# Patient Record
Sex: Female | Born: 1965 | Race: White | Hispanic: No | Marital: Married | Smoking: Never smoker
Health system: Southern US, Community
[De-identification: ages and names within clinical notes are randomized; demographics above are authoritative.]

## PROBLEM LIST (undated history)

## (undated) DIAGNOSIS — M5136 Other intervertebral disc degeneration, lumbar region: Secondary | ICD-10-CM

## (undated) DIAGNOSIS — M51369 Other intervertebral disc degeneration, lumbar region without mention of lumbar back pain or lower extremity pain: Secondary | ICD-10-CM

## (undated) DIAGNOSIS — J302 Other seasonal allergic rhinitis: Secondary | ICD-10-CM

## (undated) DIAGNOSIS — R51 Headache: Secondary | ICD-10-CM

## (undated) DIAGNOSIS — Z9889 Other specified postprocedural states: Secondary | ICD-10-CM

## (undated) DIAGNOSIS — K8020 Calculus of gallbladder without cholecystitis without obstruction: Secondary | ICD-10-CM

## (undated) DIAGNOSIS — R112 Nausea with vomiting, unspecified: Secondary | ICD-10-CM

## (undated) HISTORY — DX: Calculus of gallbladder without cholecystitis without obstruction: K80.20

## (undated) HISTORY — PX: OTHER SURGICAL HISTORY: SHX169

---

## 2005-07-07 ENCOUNTER — Other Ambulatory Visit: Admission: RE | Admit: 2005-07-07 | Discharge: 2005-07-07 | Payer: Self-pay | Admitting: Family Medicine

## 2010-10-13 ENCOUNTER — Other Ambulatory Visit (HOSPITAL_COMMUNITY)
Admission: RE | Admit: 2010-10-13 | Discharge: 2010-10-13 | Disposition: A | Discharge status: Home / Self Care | Payer: 59 | Source: Ambulatory Visit | Attending: Family Medicine | Admitting: Family Medicine

## 2010-10-13 DIAGNOSIS — Z124 Encounter for screening for malignant neoplasm of cervix: Secondary | ICD-10-CM | POA: Insufficient documentation

## 2010-11-05 ENCOUNTER — Other Ambulatory Visit: Payer: Self-pay | Admitting: Family Medicine

## 2010-11-05 DIAGNOSIS — Z1231 Encounter for screening mammogram for malignant neoplasm of breast: Secondary | ICD-10-CM

## 2010-11-13 ENCOUNTER — Ambulatory Visit
Admission: RE | Admit: 2010-11-13 | Discharge: 2010-11-13 | Disposition: A | Discharge status: Home / Self Care | Payer: 59 | Source: Ambulatory Visit | Attending: Family Medicine | Admitting: Family Medicine

## 2010-11-13 DIAGNOSIS — Z1231 Encounter for screening mammogram for malignant neoplasm of breast: Secondary | ICD-10-CM

## 2011-06-05 ENCOUNTER — Ambulatory Visit
Admission: RE | Admit: 2011-06-05 | Discharge: 2011-06-05 | Disposition: A | Discharge status: Home / Self Care | Payer: 59 | Source: Ambulatory Visit | Attending: Family Medicine | Admitting: Family Medicine

## 2011-06-05 ENCOUNTER — Other Ambulatory Visit: Payer: Self-pay | Admitting: Family Medicine

## 2011-06-05 DIAGNOSIS — M545 Low back pain: Secondary | ICD-10-CM

## 2011-06-05 DIAGNOSIS — G8929 Other chronic pain: Secondary | ICD-10-CM

## 2011-11-24 ENCOUNTER — Emergency Department (HOSPITAL_COMMUNITY): Payer: BC Managed Care – PPO

## 2011-11-24 ENCOUNTER — Emergency Department (HOSPITAL_COMMUNITY)
Admission: EM | Admit: 2011-11-24 | Discharge: 2011-11-24 | Disposition: A | Discharge status: Home / Self Care | Payer: BC Managed Care – PPO | Attending: Emergency Medicine | Admitting: Emergency Medicine

## 2011-11-24 ENCOUNTER — Encounter (HOSPITAL_COMMUNITY): Payer: Self-pay | Admitting: *Deleted

## 2011-11-24 DIAGNOSIS — M545 Low back pain, unspecified: Secondary | ICD-10-CM | POA: Insufficient documentation

## 2011-11-24 DIAGNOSIS — R109 Unspecified abdominal pain: Secondary | ICD-10-CM | POA: Insufficient documentation

## 2011-11-24 DIAGNOSIS — R112 Nausea with vomiting, unspecified: Secondary | ICD-10-CM | POA: Insufficient documentation

## 2011-11-24 LAB — URINALYSIS, ROUTINE W REFLEX MICROSCOPIC
Glucose, UA: NEGATIVE mg/dL
Hgb urine dipstick: NEGATIVE
Leukocytes, UA: NEGATIVE
Specific Gravity, Urine: 1.024 (ref 1.005–1.030)
Urobilinogen, UA: 1 mg/dL (ref 0.0–1.0)

## 2011-11-24 LAB — URINE MICROSCOPIC-ADD ON

## 2011-11-24 LAB — DIFFERENTIAL
Eosinophils Absolute: 0 10*3/uL (ref 0.0–0.7)
Eosinophils Relative: 0 % (ref 0–5)
Lymphs Abs: 0.8 10*3/uL (ref 0.7–4.0)
Monocytes Absolute: 0.3 10*3/uL (ref 0.1–1.0)
Monocytes Relative: 3 % (ref 3–12)

## 2011-11-24 LAB — COMPREHENSIVE METABOLIC PANEL
Alkaline Phosphatase: 69 U/L (ref 39–117)
BUN: 12 mg/dL (ref 6–23)
Calcium: 9.4 mg/dL (ref 8.4–10.5)
Creatinine, Ser: 0.68 mg/dL (ref 0.50–1.10)
GFR calc Af Amer: >90 mL/min (ref >90)
Glucose, Bld: 117 mg/dL — ABNORMAL HIGH (ref 70–99)
Potassium: 3.2 mEq/L — ABNORMAL LOW (ref 3.5–5.1)
Total Protein: 7.5 g/dL (ref 6.0–8.3)

## 2011-11-24 LAB — CBC
Hemoglobin: 12.1 g/dL (ref 12.0–15.0)
MCH: 27.7 pg (ref 26.0–34.0)
MCV: 79.4 fL (ref 78.0–100.0)
RBC: 4.37 MIL/uL (ref 3.87–5.11)

## 2011-11-24 MED ORDER — MORPHINE SULFATE 4 MG/ML IJ SOLN
4.0000 mg | Freq: Once | INTRAMUSCULAR | Status: AC
  Administered 2011-11-24: 4 mg via INTRAVENOUS
  Filled 2011-11-24: qty 1

## 2011-11-24 MED ORDER — ONDANSETRON 4 MG PO TBDP: 4.0000 mg | ORAL_TABLET | Freq: Three times a day (TID) | ORAL | Status: AC | PRN

## 2011-11-24 MED ORDER — IOHEXOL 300 MG/ML  SOLN
20.0000 mL | INTRAMUSCULAR | Status: AC
  Administered 2011-11-24: 20 mL via ORAL

## 2011-11-24 MED ORDER — IOHEXOL 300 MG/ML  SOLN
100.0000 mL | Freq: Once | INTRAMUSCULAR | Status: AC | PRN
  Administered 2011-11-24: 100 mL via INTRAVENOUS

## 2011-11-24 MED ORDER — OXYCODONE-ACETAMINOPHEN 5-325 MG PO TABS: 1.0000 | ORAL_TABLET | Freq: Four times a day (QID) | ORAL | Status: AC | PRN

## 2011-11-24 MED ORDER — SODIUM CHLORIDE 0.9 % IV SOLN
INTRAVENOUS | Status: DC
  Administered 2011-11-24 (×2): via INTRAVENOUS

## 2011-11-24 MED ORDER — ONDANSETRON HCL 4 MG/2ML IJ SOLN
4.0000 mg | Freq: Once | INTRAMUSCULAR | Status: AC
  Administered 2011-11-24: 4 mg via INTRAVENOUS
  Filled 2011-11-24: qty 2

## 2011-11-24 NOTE — ED Notes (Signed)
Pt states she woke up with lower back pain that has now radiated to right lower quadrant associated with nausea.

## 2011-11-24 NOTE — ED Provider Notes (Signed)
Medical screening examination/treatment/procedure(s) were conducted as a shared visit with non-physician practitioner(s) and myself.  I personally evaluated the patient during the encounter  Doug Sou, MD 11/24/11 1615

## 2011-11-24 NOTE — ED Provider Notes (Signed)
Complains of abdominal pain for several weeks. Pain became worse last night accompanied by nausea and diarrhea after eating barbecue. On exam appears mildly uncomfortable abdomen nondistended normal active bowel sounds mild tenderness at epigastrium no guarding rigidity or rebound. Pain is felt to be nonspecific  Doug Sou, MD 11/24/11 1305

## 2011-11-24 NOTE — ED Provider Notes (Signed)
Radiology and lab results reviewed, discussed with patient and with Dr. Radford Pax.  Gallstones with possible mild wall thickening, no ductal dilatation.  No elevation in liver enzymes or white count.  Pain controlled with MS and nausea controlled with zofran.  Patient tolerating po's.  Patient resting comfortably at present with family at bedside.  Spoke with on-call central  surgery--will follow-up in office--have patient call to schedule appointment.  Jimmye Norman, NP 11/24/11 2032

## 2011-11-24 NOTE — ED Notes (Signed)
Pt to CT via stretcher

## 2011-11-24 NOTE — ED Notes (Signed)
Pt ambulatory to bathroom without any problems 

## 2011-11-24 NOTE — ED Notes (Signed)
PAITENT IS SIPPING HER CT PREP AT THIS TIME. PT AWARE SHE CAN GO TO THE RESTROOM AD LIB. STATES HER PAIN IS MUCH BETTER AND SHE IS ABLE TO REST NOW.

## 2011-11-24 NOTE — Discharge Instructions (Signed)
Cholelithiasis Cholelithiasis (also called gallstones) is a form of gallbladder disease where gallstones form in your gallbladder. The gallbladder is a non-essential organ that stores bile made in the liver, which helps digest fats. Gallstones begin as small crystals and slowly grow into stones. Gallstone pain occurs when the gallbladder spasms, and a gallstone is blocking the duct. Pain can also occur when a stone passes out of the duct.  Women are more likely to develop gallstones than men. Other factors that increase the risk of gallbladder disease are:  Having multiple pregnancies. Physicians sometimes advise removing diseased gallbladders before future pregnancies.   Obesity.   Diets heavy in fried foods and fat.   Increasing age (older than 60).   Prolonged use of medications containing female hormones.   Diabetes mellitus.   Rapid weight loss.   Family history of gallstones (heredity).  SYMPTOMS  Feeling sick to your stomach (nauseous).   Abdominal pain.   Yellowing of the skin (jaundice).   Sudden pain. It may persist from several minutes to several hours.   Worsening pain with deep breathing or when jarred.   Fever.   Tenderness to the touch.  In some cases, when gallstones do not move into the bile duct, people have no pain or symptoms. These are called "silent" gallstones. TREATMENT In severe cases, emergency surgery may be required. HOME CARE INSTRUCTIONS   Only take over-the-counter or prescription medicines for pain, discomfort, or fever as directed by your caregiver.   Follow a low-fat diet until seen again. Fat causes the gallbladder to contract, which can result in pain.   Follow up as instructed. Attacks are almost always recurrent and surgery is usually required for permanent treatment.  SEEK IMMEDIATE MEDICAL CARE IF:   Your pain increases and is not controlled by medications.   You have an oral temperature above 102 F (38.9 C), not controlled by  medication.   You develop nausea and vomiting.  MAKE SURE YOU:   Understand these instructions.   Will watch your condition.   Will get help right away if you are not doing well or get worse.  Document Released: 08/06/2005 Document Revised: 07/30/2011 Document Reviewed: 10/09/2010 [employer] Patient Information 2012 [employer]. 

## 2011-11-24 NOTE — ED Provider Notes (Signed)
History     CSN: [id number]  Arrival date & time 11/24/11  1129   First MD Initiated Contact with Patient 11/24/11 1145      Chief Complaint  Patient presents with  . Back Pain  . Abdominal Pain  . Nausea    (Consider location/radiation/quality/duration/timing/severity/associated sxs/prior treatment) HPI Comments: Patient with no significant past medical history awoke this morning with right lower back pain that radiated around to her right flank and right lower abdomen. She has had multiple episodes of nausea and vomiting with this. Patient saw her primary care doctor this morning and was sent to the emergency department to rule out appendicitis or kidney stone. Patient denies fever or respiratory tract infection symptoms. She denies chest pain or shortness of breath. She denies dysuria, hematuria, vaginal discharge or bleeding. She has not had any diarrhea. The patient does not have any history of abdominal surgeries.  Patient is a 46 y.o. female presenting with abdominal pain. The history is provided by the patient.  Abdominal Pain The primary symptoms of the illness include abdominal pain, nausea and vomiting. The primary symptoms of the illness do not include fever, shortness of breath, diarrhea, dysuria, vaginal discharge or vaginal bleeding. The current episode started 3 to 5 hours ago.  Additional symptoms associated with the illness include back pain. Symptoms associated with the illness do not include constipation or hematuria.    History reviewed. No pertinent past medical history.  History reviewed. No pertinent past surgical history.  History reviewed. No pertinent family history.  History  Substance Use Topics  . Smoking status: Never Smoker   . Smokeless tobacco: Not on file  . Alcohol Use: No    OB History    Grav Para Term Preterm Abortions TAB SAB Ect Mult Living                  Review of Systems  Constitutional: Negative for fever.  HENT: Negative for  sore throat and rhinorrhea.   Eyes: Negative for redness.  Respiratory: Negative for cough and shortness of breath.   Cardiovascular: Negative for chest pain.  Gastrointestinal: Positive for nausea, vomiting and abdominal pain. Negative for diarrhea and constipation.  Genitourinary: Negative for dysuria, hematuria, vaginal bleeding and vaginal discharge.  Musculoskeletal: Positive for back pain. Negative for myalgias.  Skin: Negative for rash.  Neurological: Negative for headaches.    Allergies  Review of patient's allergies indicates not on file.  Home Medications  No current outpatient prescriptions on file.  BP 123/87  Pulse 79  Temp(Src) 97.7 F (36.5 C) (Oral)  Resp 18  SpO2 100%  Physical Exam  Nursing note and vitals reviewed. Constitutional: She is oriented to person, place, and time. She appears well-developed and well-nourished.  HENT:  Head: Normocephalic and atraumatic.  Eyes: Conjunctivae are normal. Right eye exhibits no discharge. Left eye exhibits no discharge.  Neck: Normal range of motion. Neck supple.  Cardiovascular: Normal rate, regular rhythm and normal heart sounds.   Pulmonary/Chest: Effort normal and breath sounds normal.  Abdominal: Soft. She exhibits no distension. There is tenderness. There is no rigidity, no rebound, no guarding, no tenderness at McBurney's point and negative Murphy's sign.         Patient states that she has tenderness from right middle abdomen to umbilicus. She does not wince on exam. She has to search for the pain on herself by pushing on her abdomen multiple times.  Neurological: She is alert and oriented to person, place, and  time.  Skin: Skin is warm and dry.  Psychiatric: She has a normal mood and affect.    ED Course  Procedures (including critical care time)  Labs Reviewed  URINALYSIS, ROUTINE W REFLEX MICROSCOPIC - Abnormal; Notable for the following:    pH 8.5 (*)    Ketones, ur 15 (*)    Protein, ur 30 (*)     All other components within normal limits  CBC - Abnormal; Notable for the following:    HCT 34.7 (*)    All other components within normal limits  DIFFERENTIAL - Abnormal; Notable for the following:    Neutrophils Relative 88 (*)    Neutro Abs 8.9 (*)    Lymphocytes Relative 8 (*)    All other components within normal limits  COMPREHENSIVE METABOLIC PANEL - Abnormal; Notable for the following:    Potassium 3.2 (*)    Glucose, Bld 117 (*)    All other components within normal limits  URINE MICROSCOPIC-ADD ON  PREGNANCY, URINE   No results found.   1. Abdominal pain     12:05 PM Patient seen and examined. Work-up initiated. Medications ordered.   Vital signs reviewed and are as follows: Filed Vitals:   11/24/11 1135  BP: 123/87  Pulse: 79  Temp: 97.7 F (36.5 C)  Resp: 18   Patient was discussed with Dr. Ethelda Chick.  2:25 PM Patient moved to CDU pending CT abd/pelvis. Handoff to [employer].  Plan: control pain, dispo per CT results. If neg, symptomatic treatment and have patient follow-up with PCP.   MDM  Abd pain, r/o appendicitis. CT pending.         Renne Crigler, [place] 11/24/11 1427

## 2011-11-24 NOTE — ED Notes (Signed)
Pt resting quietly at this time.  Husband at bedside. 

## 2011-11-24 NOTE — ED Notes (Signed)
Pt finished drinking contrast. CT made aware.

## 2011-11-26 NOTE — ED Provider Notes (Signed)
Medical screening examination/treatment/procedure(s) were performed by non-physician practitioner and as supervising physician I was immediately available for consultation/collaboration.    Nelia Shi, MD 11/26/11 [phone]

## 2011-11-28 IMAGING — CR DG SACRUM/COCCYX 2+V
4 series · 4 of 4 positions shown · non-contrast
Comparison: None

CLINICAL DATA: Chronic low back pain

SACRUM AND COCCYX - 2+ VIEW

[view not recorded (1 of 4)]
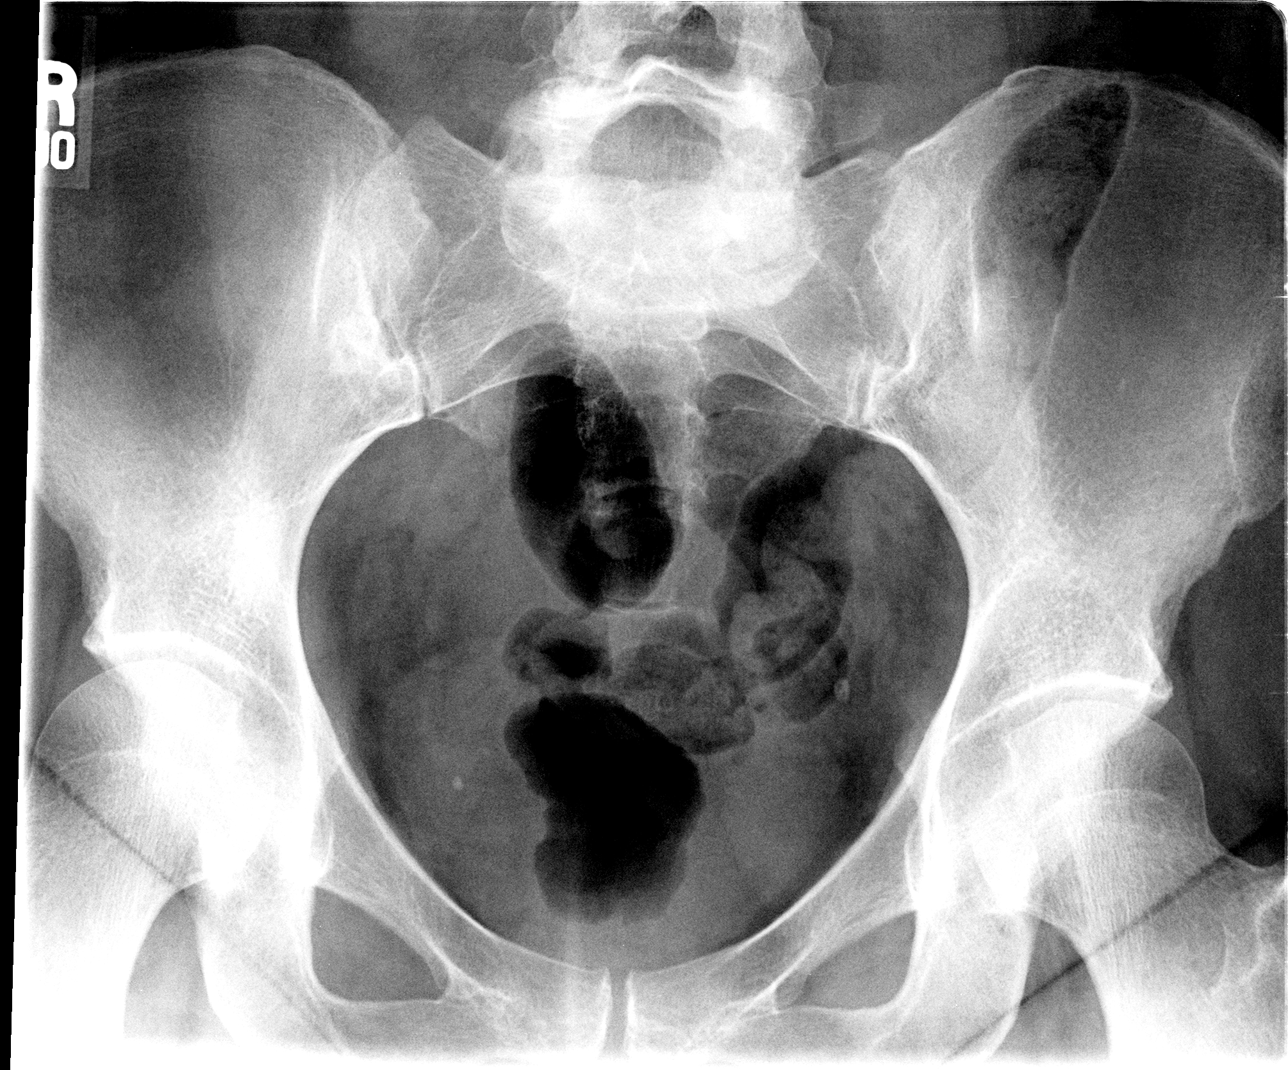

[view not recorded (2 of 4)]
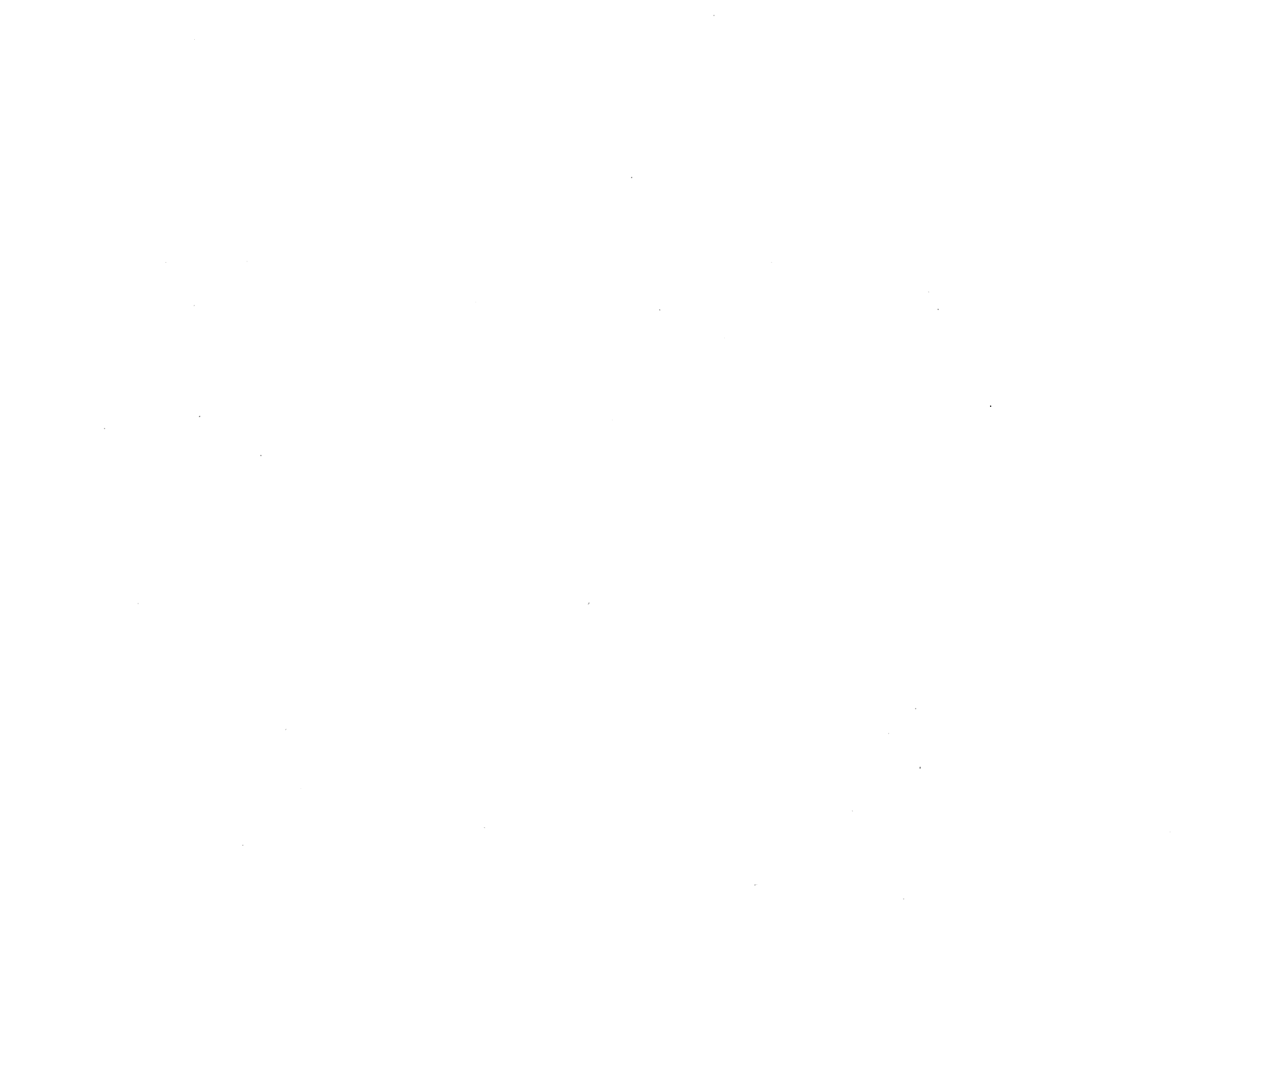

[view not recorded (3 of 4)]
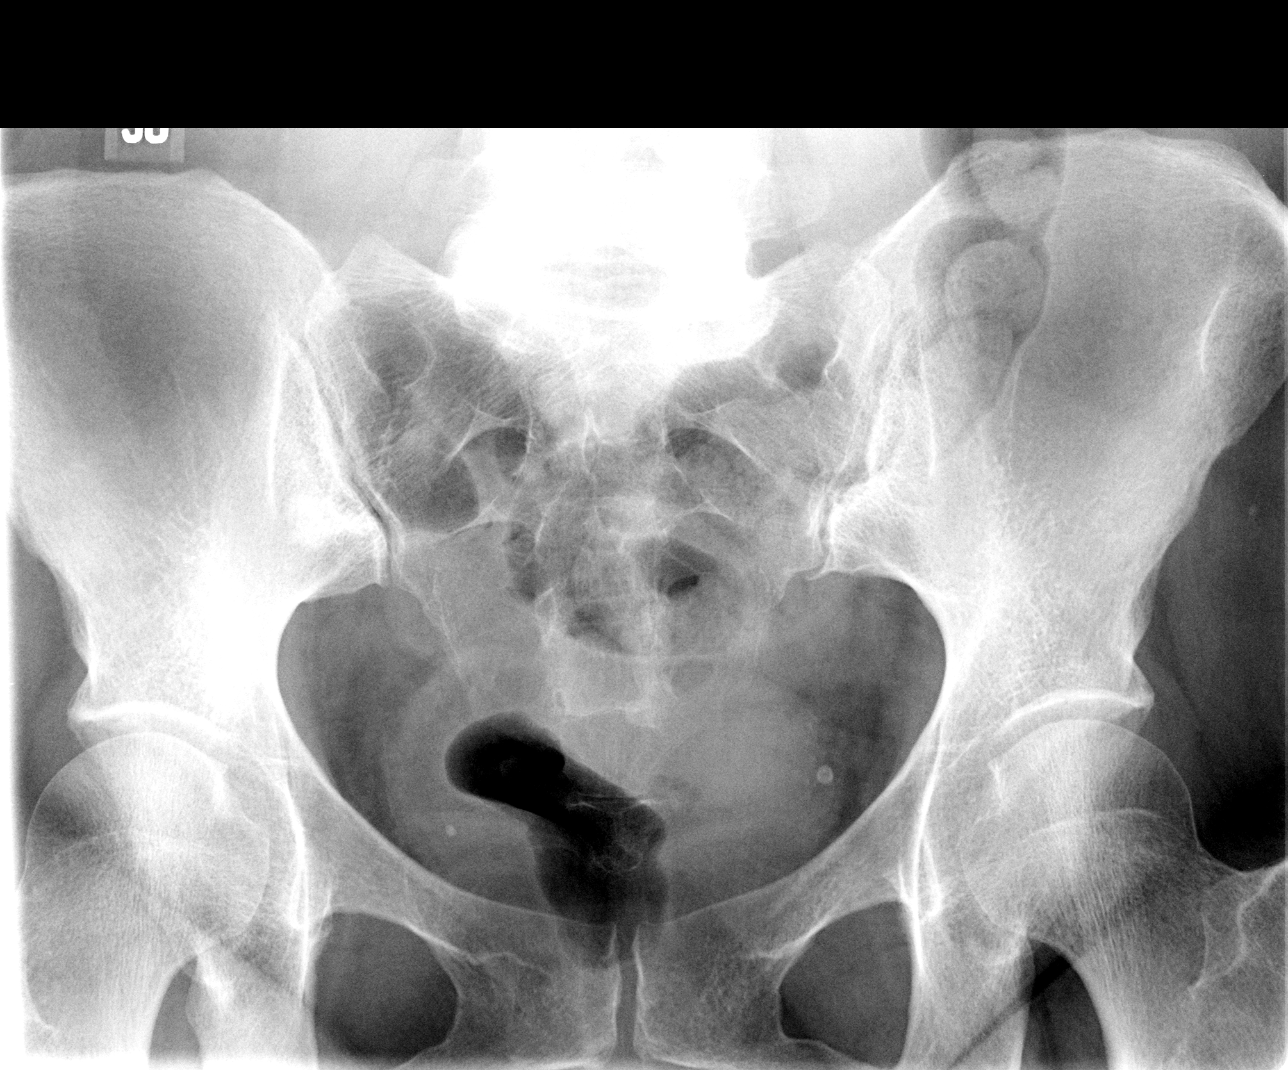

[view not recorded (4 of 4)]
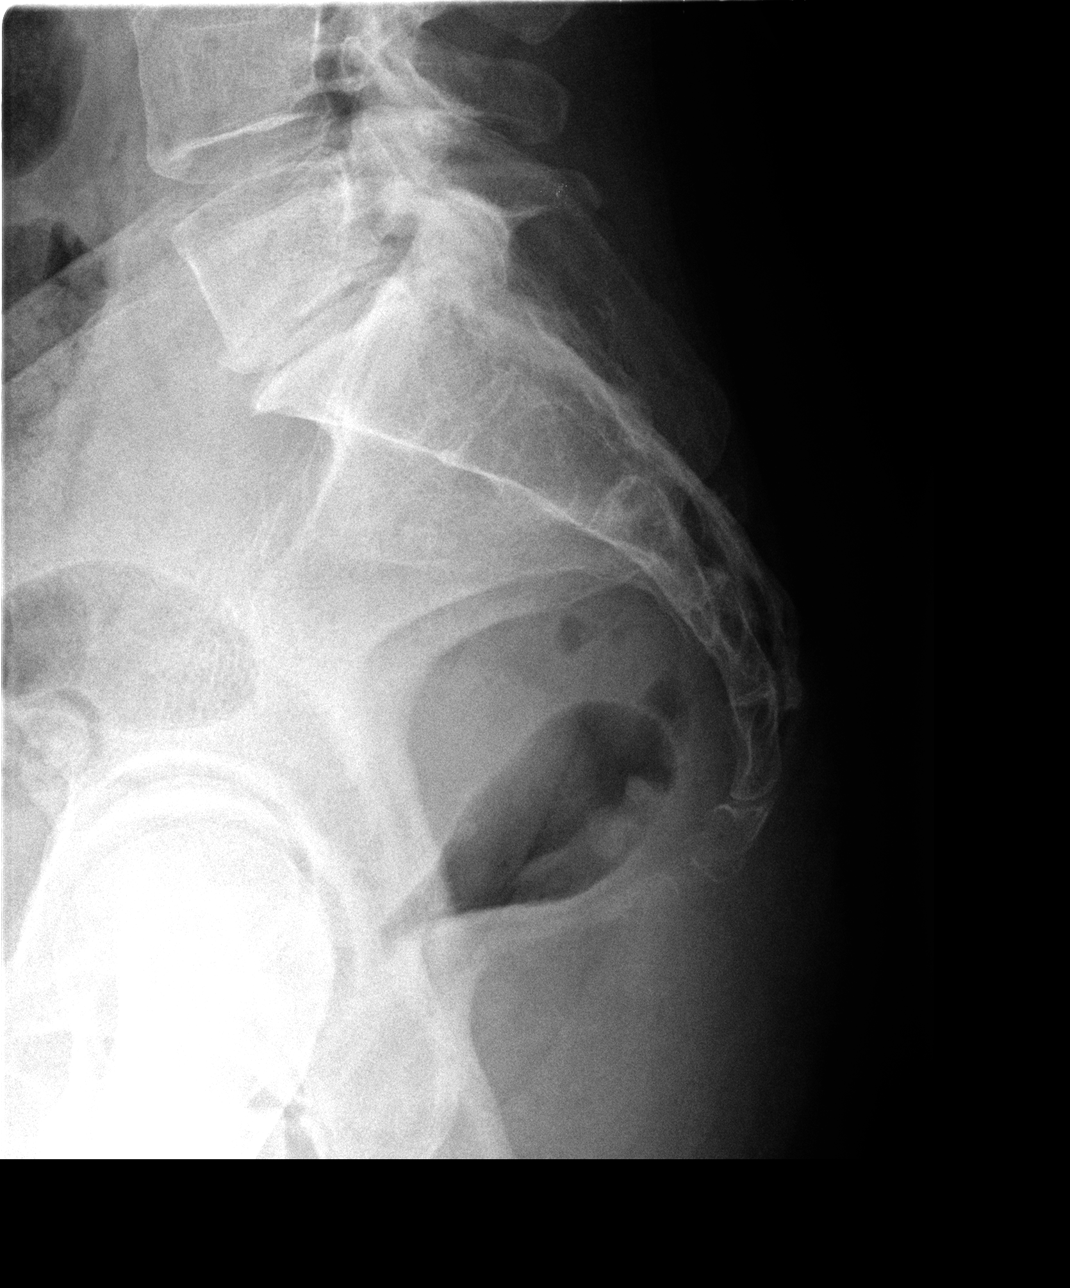

[4 of 4 positions shown; findings below may reference images not displayed]

FINDINGS: Mild degenerative change in the SI joint bilaterally with
mild spurring.  Disc degeneration and mild spurring at L5-S1.

Negative for fracture of the sacrum.  No focal bony lesion.
IMPRESSION: Mild degenerative changes in the sacroiliac joints and at L5-S1.
No acute abnormality.

## 2011-12-01 ENCOUNTER — Ambulatory Visit (INDEPENDENT_AMBULATORY_CARE_PROVIDER_SITE_OTHER): Payer: BC Managed Care – PPO | Admitting: General Surgery

## 2011-12-15 ENCOUNTER — Ambulatory Visit (INDEPENDENT_AMBULATORY_CARE_PROVIDER_SITE_OTHER): Payer: BC Managed Care – PPO | Admitting: Surgery

## 2011-12-15 ENCOUNTER — Encounter (INDEPENDENT_AMBULATORY_CARE_PROVIDER_SITE_OTHER): Payer: Self-pay | Admitting: Surgery

## 2011-12-15 VITALS — BP 126/80 | HR 68 | Temp 97.7°F | Resp 18 | Ht 65.5 in | Wt 149.1 lb

## 2011-12-15 DIAGNOSIS — K8020 Calculus of gallbladder without cholecystitis without obstruction: Secondary | ICD-10-CM

## 2011-12-15 NOTE — Progress Notes (Signed)
Patient ID: Tricia Benson, female   DOB: 02-15-1966, 47 y.o.   MRN: [id number]  Chief Complaint  Patient presents with  . Cholelithiasis    HPI Tricia Benson is a 46 y.o. female.   HPIPatient sent at the request of Dr.Alm due to right-sided abdominal pain. She was seen in the emergency room 3 weeks ago with the sudden onset of right flank and right upper quadrant abdominal pain that radiated toward her left side. The pain was sharp in nature was a 10 out of 10. It was associated with nausea and vomiting. This is the first time she's never had this type of pain. She has had no dysuria or polyuria. She has no suprapubic pain. CT scan showed multiple gallstones with minimal gallbladder wall thickening.  Past Medical History  Diagnosis Date  . Gallstones     History reviewed. No pertinent past surgical history.  History reviewed. No pertinent family history.  Social History History  Substance Use Topics  . Smoking status: Never Smoker   . Smokeless tobacco: Never Used  . Alcohol Use: No    Allergies  Allergen Reactions  . Aspirin Rash    Current Outpatient Prescriptions  Medication Sig Dispense Refill  . ibuprofen (ADVIL,MOTRIN) 200 MG tablet Take 200 mg by mouth every 6 (six) hours as needed. For headache pain      . Pseudoephedrine-APAP-DM (DAYQUIL PO) Take 30 mLs by mouth daily as needed. For cold        Review of Systems Review of Systems  Constitutional: Negative for fever, chills and unexpected weight change.  HENT: Negative for hearing loss, congestion, sore throat, trouble swallowing and voice change.   Eyes: Negative for visual disturbance.  Respiratory: Negative for cough and wheezing.   Cardiovascular: Negative for chest pain, palpitations and leg swelling.  Gastrointestinal: Negative for nausea, vomiting, abdominal pain, diarrhea, constipation, blood in stool, abdominal distention and anal bleeding.  Genitourinary: Negative for hematuria, vaginal  bleeding and difficulty urinating.  Musculoskeletal: Negative for arthralgias.  Skin: Negative for rash and wound.  Neurological: Negative for seizures, syncope and headaches.  Hematological: Negative for adenopathy. Does not bruise/bleed easily.  Psychiatric/Behavioral: Negative for confusion.    Blood pressure 126/80, pulse 68, temperature 97.7 F (36.5 C), temperature source Temporal, resp. rate 18, height 5' 5.5" (1.664 m), weight 149 lb 2 oz (67.643 kg).  Physical Exam Physical Exam  Constitutional: She is oriented to person, place, and time. She appears well-developed and well-nourished.  HENT:  Head: Normocephalic and atraumatic.  Eyes: EOM are normal. Pupils are equal, round, and reactive to light.  Neck: Normal range of motion. Neck supple.  Cardiovascular: Normal rate and regular rhythm.   Pulmonary/Chest: Effort normal and breath sounds normal.  Abdominal: Soft. Bowel sounds are normal. She exhibits no distension. There is no tenderness. There is no rebound.  Musculoskeletal: Normal range of motion.  Neurological: She is alert and oriented to person, place, and time.  Skin: Skin is warm and dry.  Psychiatric: She has a normal mood and affect. Her behavior is normal. Judgment and thought content normal.    Data Reviewed CT as above.  CBC AND LFT OK  Assessment    Symptomatic cholelithiasis    Plan    Laparoscopic cholecystectomy cholangiogram.The procedure has been discussed with the patient. Operative and non operative treatments have been discussed. Risks of surgery include bleeding, infection,  Common bile duct injury,  Injury to the stomach,liver, colon,small intestine, abdominal wall,  Diaphragm,  Major blood vessels,  And the need for an open procedure.  Other risks include worsening of medical problems, death,  DVT and pulmonary embolism, and cardiovascular events.   Medical options have also been discussed. The patient has been informed of long term expectations  of surgery and non surgical options,  The patient agrees to proceed.          Velia Pamer A. 12/15/2011, 10:38 AM

## 2011-12-15 NOTE — Patient Instructions (Signed)

## 2012-01-05 ENCOUNTER — Encounter (HOSPITAL_COMMUNITY): Payer: Self-pay | Admitting: Pharmacy Technician

## 2012-01-07 ENCOUNTER — Encounter (HOSPITAL_COMMUNITY): Payer: Self-pay

## 2012-01-07 ENCOUNTER — Ambulatory Visit (HOSPITAL_COMMUNITY)
Admission: RE | Admit: 2012-01-07 | Discharge: 2012-01-07 | Disposition: A | Discharge status: Home / Self Care | Payer: BC Managed Care – PPO | Source: Ambulatory Visit | Attending: Surgery | Admitting: Surgery

## 2012-01-07 ENCOUNTER — Encounter (HOSPITAL_COMMUNITY)
Admission: RE | Admit: 2012-01-07 | Discharge: 2012-01-07 | Disposition: A | Discharge status: Home / Self Care | Payer: BC Managed Care – PPO | Source: Ambulatory Visit | Attending: Surgery | Admitting: Surgery

## 2012-01-07 DIAGNOSIS — K8020 Calculus of gallbladder without cholecystitis without obstruction: Secondary | ICD-10-CM | POA: Insufficient documentation

## 2012-01-07 DIAGNOSIS — Z01812 Encounter for preprocedural laboratory examination: Secondary | ICD-10-CM | POA: Insufficient documentation

## 2012-01-07 DIAGNOSIS — Z01818 Encounter for other preprocedural examination: Secondary | ICD-10-CM | POA: Insufficient documentation

## 2012-01-07 HISTORY — DX: Other intervertebral disc degeneration, lumbar region: M51.36

## 2012-01-07 HISTORY — DX: Other specified postprocedural states: R11.2

## 2012-01-07 HISTORY — DX: Headache: R51

## 2012-01-07 HISTORY — DX: Other intervertebral disc degeneration, lumbar region without mention of lumbar back pain or lower extremity pain: M51.369

## 2012-01-07 HISTORY — DX: Other specified postprocedural states: Z98.890

## 2012-01-07 HISTORY — DX: Other seasonal allergic rhinitis: J30.2

## 2012-01-07 LAB — DIFFERENTIAL
Eosinophils Relative: 10 % — ABNORMAL HIGH (ref 0–5)
Monocytes Relative: 7 % (ref 3–12)

## 2012-01-07 LAB — COMPREHENSIVE METABOLIC PANEL
ALT: 10 U/L (ref 0–35)
AST: 14 U/L (ref 0–37)
Albumin: 4.3 g/dL (ref 3.5–5.2)
Alkaline Phosphatase: 70 U/L (ref 39–117)
CO2: 27 mEq/L (ref 19–32)
Chloride: 101 mEq/L (ref 96–112)
GFR calc non Af Amer: 80 mL/min — ABNORMAL LOW (ref >90)
Potassium: 4.6 mEq/L (ref 3.5–5.1)
Sodium: 140 mEq/L (ref 135–145)
Total Bilirubin: 0.7 mg/dL (ref 0.3–1.2)

## 2012-01-07 LAB — CBC
MCV: 83.9 fL (ref 78.0–100.0)
Platelets: 379 10*3/uL (ref 150–400)
RBC: 4.36 MIL/uL (ref 3.87–5.11)
RDW: 14.2 % (ref 11.5–15.5)
WBC: 4.5 10*3/uL (ref 4.0–10.5)

## 2012-01-07 LAB — HCG, SERUM, QUALITATIVE: Preg, Serum: NEGATIVE

## 2012-01-07 LAB — SURGICAL PCR SCREEN: Staphylococcus aureus: NEGATIVE

## 2012-01-07 NOTE — Patient Instructions (Signed)
YOUR SURGERY IS SCHEDULED ON:  Friday  5/24  AT  9:15 AM  REPORT TO [place] [facility]ORT STAY [facility] AT:  7:15 AM      PHONE # FOR SHORT STAY IS [phone]  DO NOT EAT OR DRINK ANYTHING AFTER MIDNIGHT THE NIGHT BEFORE YOUR SURGERY.  YOU MAY BRUSH YOUR TEETH, RINSE OUT YOUR MOUTH--BUT NO WATER, NO FOOD, NO CHEWING GUM, NO MINTS, NO CANDIES, NO CHEWING TOBACCO.  PLEASE TAKE THE FOLLOWING MEDICATIONS THE AM OF YOUR SURGERY WITH A FEW SIPS OF WATER:  NO MEDICINES TO TAKE THE AM OF YOUR SURGERY.    IF YOU USE INHALERS--USE YOUR INHALERS THE AM OF YOUR SURGERY AND BRING INHALERS TO THE HOSPITAL -TAKE TO SURGERY.    IF YOU ARE DIABETIC:  DO NOT TAKE ANY DIABETIC MEDICATIONS THE AM OF YOUR SURGERY.  IF YOU TAKE INSULIN IN THE EVENINGS--PLEASE ONLY TAKE 1/2 NORMAL EVENING DOSE THE NIGHT BEFORE YOUR SURGERY.  NO INSULIN THE AM OF YOUR SURGERY.  IF YOU HAVE SLEEP APNEA AND USE CPAP OR BIPAP--PLEASE BRING THE MASK --NOT THE MACHINE-NOT THE TUBING   -JUST THE MASK. DO NOT BRING VALUABLES, MONEY, CREDIT CARDS.  CONTACT LENS, DENTURES / PARTIALS, GLASSES SHOULD NOT BE WORN TO SURGERY AND IN MOST CASES-HEARING AIDS WILL NEED TO BE REMOVED.  BRING YOUR GLASSES CASE, ANY EQUIPMENT NEEDED FOR YOUR CONTACT LENS. FOR PATIENTS ADMITTED TO THE HOSPITAL--CHECK OUT TIME THE DAY OF DISCHARGE IS 11:00 AM.  ALL INPATIENT ROOMS ARE PRIVATE - WITH BATHROOM, TELEPHONE, TELEVISION AND WIFI INTERNET. IF YOU ARE BEING DISCHARGED THE SAME DAY OF YOUR SURGERY--YOU CAN NOT DRIVE YOURSELF HOME--AND SHOULD NOT GO HOME ALONE BY TAXI OR BUS.  NO DRIVING OR OPERATING MACHINERY FOR 24 HOURS FOLLOWING ANESTHESIA / PAIN MEDICATIONS.                            SPECIAL INSTRUCTIONS:  CHLORHEXIDINE SOAP SHOWER (other brand names are Betasept and Hibiclens ) PLEASE SHOWER WITH CHLORHEXIDINE THE NIGHT BEFORE YOUR SURGERY AND THE AM OF YOUR SURGERY. DO NOT USE CHLORHEXIDINE ON YOUR FACE OR PRIVATE AREAS--YOU MAY USE YOUR NORMAL SOAP THOSE AREAS  AND YOUR NORMAL SHAMPOO.  WOMEN SHOULD AVOID SHAVING UNDER ARMS AND SHAVING LEGS 48 HOURS BEFORE USING CHLORHEXIDINE TO AVOID SKIN IRRITATION.  DO NOT USE IF ALLERGIC TO CHLORHEXIDINE.  PLEASE READ OVER ANY  FACT SHEETS THAT YOU WERE GIVEN: MRSA INFORMATION

## 2012-01-07 NOTE — Pre-Procedure Instructions (Signed)
CBC WITH DIFF, CMET, SERUM PREGNANCY TEST,  CXR WERE DONE TODAY - PREOP AT [facility].  EKG NOT NEEDED PER ANESTHESIOLOGIST'S GUIDELINES

## 2012-01-15 ENCOUNTER — Encounter (HOSPITAL_COMMUNITY): Payer: Self-pay | Admitting: Anesthesiology

## 2012-01-15 ENCOUNTER — Ambulatory Visit (HOSPITAL_COMMUNITY): Payer: BC Managed Care – PPO

## 2012-01-15 ENCOUNTER — Encounter (HOSPITAL_COMMUNITY): Payer: Self-pay | Admitting: *Deleted

## 2012-01-15 ENCOUNTER — Ambulatory Visit (HOSPITAL_COMMUNITY)
Admission: RE | Admit: 2012-01-15 | Discharge: 2012-01-15 | Disposition: A | Discharge status: Home / Self Care | Payer: BC Managed Care – PPO | Source: Ambulatory Visit | Attending: Surgery | Admitting: Surgery

## 2012-01-15 ENCOUNTER — Ambulatory Visit (HOSPITAL_COMMUNITY): Payer: BC Managed Care – PPO | Admitting: Anesthesiology

## 2012-01-15 ENCOUNTER — Encounter (HOSPITAL_COMMUNITY)
Admission: RE | Disposition: A | Discharge status: Home / Self Care | Payer: Self-pay | Source: Ambulatory Visit | Attending: Surgery

## 2012-01-15 ENCOUNTER — Encounter (HOSPITAL_COMMUNITY): Payer: Self-pay | Admitting: Surgery

## 2012-01-15 DIAGNOSIS — K8010 Calculus of gallbladder with chronic cholecystitis without obstruction: Secondary | ICD-10-CM | POA: Insufficient documentation

## 2012-01-15 DIAGNOSIS — K8020 Calculus of gallbladder without cholecystitis without obstruction: Secondary | ICD-10-CM

## 2012-01-15 HISTORY — PX: CHOLECYSTECTOMY: SHX55

## 2012-01-15 SURGERY — LAPAROSCOPIC CHOLECYSTECTOMY WITH INTRAOPERATIVE CHOLANGIOGRAM
Anesthesia: General | Site: Abdomen | Wound class: Clean Contaminated

## 2012-01-15 MED ORDER — LACTATED RINGERS IR SOLN
Status: DC | PRN
  Administered 2012-01-15: 1000 mL

## 2012-01-15 MED ORDER — OXYCODONE-ACETAMINOPHEN 5-325 MG PO TABS: 1.0000 | ORAL_TABLET | ORAL | Status: AC | PRN

## 2012-01-15 MED ORDER — CEFAZOLIN SODIUM 1-5 GM-% IV SOLN
INTRAVENOUS | Status: AC
  Filled 2012-01-15: qty 50

## 2012-01-15 MED ORDER — OXYCODONE HCL 5 MG PO TABS: 5.0000 mg | ORAL_TABLET | ORAL | Status: DC | PRN

## 2012-01-15 MED ORDER — MORPHINE SULFATE 10 MG/ML IJ SOLN: 1.0000 mg | INTRAMUSCULAR | Status: DC | PRN

## 2012-01-15 MED ORDER — ONDANSETRON HCL 4 MG/2ML IJ SOLN
INTRAMUSCULAR | Status: DC | PRN
  Administered 2012-01-15: 4 mg via INTRAVENOUS

## 2012-01-15 MED ORDER — BUPIVACAINE-EPINEPHRINE 0.25% -1:200000 IJ SOLN
INTRAMUSCULAR | Status: DC | PRN
  Administered 2012-01-15: 20 mL

## 2012-01-15 MED ORDER — 0.9 % SODIUM CHLORIDE (POUR BTL) OPTIME
TOPICAL | Status: DC | PRN
  Administered 2012-01-15: 1000 mL

## 2012-01-15 MED ORDER — DEXAMETHASONE SODIUM PHOSPHATE 10 MG/ML IJ SOLN
INTRAMUSCULAR | Status: DC | PRN
  Administered 2012-01-15: 10 mg via INTRAVENOUS

## 2012-01-15 MED ORDER — ACETAMINOPHEN 10 MG/ML IV SOLN
INTRAVENOUS | Status: AC
  Filled 2012-01-15: qty 100

## 2012-01-15 MED ORDER — EPHEDRINE SULFATE 50 MG/ML IJ SOLN
INTRAMUSCULAR | Status: DC | PRN
  Administered 2012-01-15: 10 mg via INTRAVENOUS
  Administered 2012-01-15 (×2): 5 mg via INTRAVENOUS

## 2012-01-15 MED ORDER — ACETAMINOPHEN 325 MG PO TABS: 650.0000 mg | ORAL_TABLET | ORAL | Status: DC | PRN

## 2012-01-15 MED ORDER — MORPHINE SULFATE 2 MG/ML IJ SOLN
INTRAMUSCULAR | Status: AC
  Administered 2012-01-15: 0.5 mg via INTRAVENOUS
  Filled 2012-01-15: qty 1

## 2012-01-15 MED ORDER — GLYCOPYRROLATE 0.2 MG/ML IJ SOLN
INTRAMUSCULAR | Status: DC | PRN
  Administered 2012-01-15: .4 mg via INTRAVENOUS
  Administered 2012-01-15: 0.1 mg via INTRAVENOUS

## 2012-01-15 MED ORDER — SODIUM CHLORIDE 0.9 % IV SOLN: 250.0000 mL | INTRAVENOUS | Status: DC | PRN

## 2012-01-15 MED ORDER — SODIUM CHLORIDE 0.9 % IJ SOLN: 3.0000 mL | Freq: Two times a day (BID) | INTRAMUSCULAR | Status: DC

## 2012-01-15 MED ORDER — IOHEXOL 300 MG/ML  SOLN
INTRAMUSCULAR | Status: AC
  Filled 2012-01-15: qty 1

## 2012-01-15 MED ORDER — CEFAZOLIN SODIUM 1-5 GM-% IV SOLN
1.0000 g | INTRAVENOUS | Status: AC
  Administered 2012-01-15: 1 g via INTRAVENOUS

## 2012-01-15 MED ORDER — PROMETHAZINE HCL 25 MG/ML IJ SOLN: 6.2500 mg | INTRAMUSCULAR | Status: DC | PRN

## 2012-01-15 MED ORDER — ROCURONIUM BROMIDE 100 MG/10ML IV SOLN
INTRAVENOUS | Status: DC | PRN
  Administered 2012-01-15: 40 mg via INTRAVENOUS

## 2012-01-15 MED ORDER — ACETAMINOPHEN 650 MG RE SUPP
650.0000 mg | RECTAL | Status: DC | PRN
  Filled 2012-01-15: qty 1

## 2012-01-15 MED ORDER — HYDROMORPHONE HCL PF 1 MG/ML IJ SOLN: 0.2500 mg | INTRAMUSCULAR | Status: DC | PRN

## 2012-01-15 MED ORDER — FENTANYL CITRATE 0.05 MG/ML IJ SOLN
INTRAMUSCULAR | Status: DC | PRN
  Administered 2012-01-15 (×2): 50 ug via INTRAVENOUS
  Administered 2012-01-15: 100 ug via INTRAVENOUS
  Administered 2012-01-15: 50 ug via INTRAVENOUS

## 2012-01-15 MED ORDER — MIDAZOLAM HCL 5 MG/5ML IJ SOLN
INTRAMUSCULAR | Status: DC | PRN
  Administered 2012-01-15: 2 mg via INTRAVENOUS

## 2012-01-15 MED ORDER — IOHEXOL 300 MG/ML  SOLN
INTRAMUSCULAR | Status: DC | PRN
  Administered 2012-01-15: 3 mL

## 2012-01-15 MED ORDER — SODIUM CHLORIDE 0.9 % IJ SOLN: 3.0000 mL | INTRAMUSCULAR | Status: DC | PRN

## 2012-01-15 MED ORDER — PROPOFOL 10 MG/ML IV EMUL
INTRAVENOUS | Status: DC | PRN
  Administered 2012-01-15: 150 mg via INTRAVENOUS

## 2012-01-15 MED ORDER — ACETAMINOPHEN 10 MG/ML IV SOLN
INTRAVENOUS | Status: DC | PRN
  Administered 2012-01-15: 1000 mg via INTRAVENOUS

## 2012-01-15 MED ORDER — ONDANSETRON HCL 4 MG/2ML IJ SOLN: 4.0000 mg | Freq: Four times a day (QID) | INTRAMUSCULAR | Status: DC | PRN

## 2012-01-15 MED ORDER — BUPIVACAINE-EPINEPHRINE PF 0.25-1:200000 % IJ SOLN
INTRAMUSCULAR | Status: AC
  Filled 2012-01-15: qty 30

## 2012-01-15 MED ORDER — LACTATED RINGERS IV SOLN
INTRAVENOUS | Status: DC | PRN
  Administered 2012-01-15 (×2): via INTRAVENOUS

## 2012-01-15 MED ORDER — LIDOCAINE HCL (CARDIAC) 20 MG/ML IV SOLN
INTRAVENOUS | Status: DC | PRN
  Administered 2012-01-15: 60 mg via INTRAVENOUS

## 2012-01-15 MED ORDER — NEOSTIGMINE METHYLSULFATE 1 MG/ML IJ SOLN
INTRAMUSCULAR | Status: DC | PRN
  Administered 2012-01-15: 4 mg via INTRAVENOUS

## 2012-01-15 SURGICAL SUPPLY — 39 items
ADH SKN CLS APL DERMABOND .7 (GAUZE/BANDAGES/DRESSINGS) ×1
APPLIER CLIP ROT 10 11.4 M/L (STAPLE)
APR CLP MED LRG 11.4X10 (STAPLE)
BAG SPEC RTRVL LRG 6X4 10 (ENDOMECHANICALS)
CANISTER SUCTION 2500CC (MISCELLANEOUS) ×2 IMPLANT
CHLORAPREP W/TINT 26ML (MISCELLANEOUS) ×2 IMPLANT
CLIP APPLIE ROT 10 11.4 M/L (STAPLE) ×1 IMPLANT
CLOTH BEACON ORANGE TIMEOUT ST (SAFETY) ×2 IMPLANT
COVER MAYO STAND STRL (DRAPES) ×2 IMPLANT
DECANTER SPIKE VIAL GLASS SM (MISCELLANEOUS) ×2 IMPLANT
DERMABOND ADVANCED (GAUZE/BANDAGES/DRESSINGS) ×1
DERMABOND ADVANCED .7 DNX12 (GAUZE/BANDAGES/DRESSINGS) ×1 IMPLANT
DRAPE C-ARM 42X72 X-RAY (DRAPES) ×2 IMPLANT
DRAPE LAPAROSCOPIC ABDOMINAL (DRAPES) ×2 IMPLANT
DRAPE WARM FLUID 44X44 (DRAPE) ×1 IMPLANT
ELECT REM PT RETURN 9FT ADLT (ELECTROSURGICAL) ×2
ELECTRODE REM PT RTRN 9FT ADLT (ELECTROSURGICAL) ×1 IMPLANT
FILTER SMOKE EVAC LAPAROSHD (FILTER) ×1 IMPLANT
GLOVE BIOGEL PI IND STRL 7.0 (GLOVE) ×1 IMPLANT
GLOVE BIOGEL PI INDICATOR 7.0 (GLOVE) ×1
GLOVE INDICATOR 8.0 STRL GRN (GLOVE) ×4 IMPLANT
GLOVE SS BIOGEL STRL SZ 8 (GLOVE) ×1 IMPLANT
GLOVE SUPERSENSE BIOGEL SZ 8 (GLOVE) ×1
GOWN STRL NON-REIN LRG LVL3 (GOWN DISPOSABLE) ×1 IMPLANT
GOWN STRL REIN XL XLG (GOWN DISPOSABLE) ×2 IMPLANT
HEMOSTAT SNOW SURGICEL 2X4 (HEMOSTASIS) ×1 IMPLANT
HEMOSTAT SURGICEL 4X8 (HEMOSTASIS) IMPLANT
KIT BASIN OR (CUSTOM PROCEDURE TRAY) ×2 IMPLANT
POUCH SPECIMEN RETRIEVAL 10MM (ENDOMECHANICALS) IMPLANT
SCISSORS LAP 5X35 DISP (ENDOMECHANICALS) IMPLANT
SET CHOLANGIOGRAPH MIX (MISCELLANEOUS) ×2 IMPLANT
SET IRRIG TUBING LAPAROSCOPIC (IRRIGATION / IRRIGATOR) ×2 IMPLANT
SUT MNCRL AB 4-0 PS2 18 (SUTURE) ×2 IMPLANT
TOWEL OR 17X26 10 PK STRL BLUE (TOWEL DISPOSABLE) ×1 IMPLANT
TRAY LAP CHOLE (CUSTOM PROCEDURE TRAY) ×2 IMPLANT
TROCAR BLADELESS OPT 5 75 (ENDOMECHANICALS) ×4 IMPLANT
TROCAR XCEL BLUNT TIP 100MML (ENDOMECHANICALS) ×2 IMPLANT
TROCAR XCEL NON-BLD 11X100MML (ENDOMECHANICALS) ×2 IMPLANT
TUBING INSUFFLATION 10FT LAP (TUBING) ×2 IMPLANT

## 2012-01-15 NOTE — Op Note (Signed)
Laparoscopic Cholecystectomy with IOC Procedure Note  Indications: This patient presents with symptomatic gallbladder disease and will undergo laparoscopic cholecystectomy.  Pre-operative Diagnosis: Calculus of gallbladder without mention of cholecystitis or obstruction  Post-operative Diagnosis: Same  Surgeon: Monick Rena A.   Assistants: OR  Anesthesia: General endotracheal anesthesia and Local anesthesia 0.25.% bupivacaine, with epinephrine  ASA Class: 2  Procedure Details  The patient was seen again in the Holding Room. The risks, benefits, complications, treatment options, and expected outcomes were discussed with the patient. The possibilities of reaction to medication, pulmonary aspiration, perforation of viscus, bleeding, recurrent infection, finding a normal gallbladder, the need for additional procedures, failure to diagnose a condition, the possible need to convert to an open procedure, and creating a complication requiring transfusion or operation were discussed with the patient. The patient and/or family concurred with the proposed plan, giving informed consent. The site of surgery properly noted/marked. The patient was taken to Operating Room, identified as Miachel Roux and the procedure verified as Laparoscopic Cholecystectomy with Intraoperative Cholangiograms. A Time Out was held and the above information confirmed.  Prior to the induction of general anesthesia, antibiotic prophylaxis was administered. General endotracheal anesthesia was then administered and tolerated well. After the induction, the abdomen was prepped in the usual sterile fashion. The patient was positioned in the supine position with the left arm comfortably tucked, along with some reverse Trendelenburg.  Local anesthetic agent was injected into the skin near the umbilicus and an incision made. The midline fascia was incised and the Hasson technique was used to introduce a 10 mm port under direct  vision. It was secured with a figure of eight Vicryl suture placed in the usual fashion. Pneumoperitoneum was then created with CO2 and tolerated well without any adverse changes in the patient's vital signs. Additional trocars were introduced under direct vision. All skin incisions were infiltrated with a local anesthetic agent before making the incision and placing the trocars.   The gallbladder was identified, the fundus grasped and retracted cephalad. Adhesions were lysed bluntly and with the electrocautery where indicated, taking care not to injure any adjacent organs or viscus. The infundibulum was grasped and retracted laterally, exposing the peritoneum overlying the triangle of Calot. This was then divided and exposed in a blunt fashion. The cystic duct was clearly identified and bluntly dissected circumferentially. The junctions of the gallbladder, cystic duct and common bile duct were clearly identified prior to the division of any linear structure .    An incision was made in the cystic duct and the cholangiogram catheter introduced. The catheter was secured using an endoclip. The study showed no stones and good visualization of the distal and proximal biliary tree. The catheter was then removed.   The cystic duct was then  ligated with surgical clips  on the patient side and singly clipped on the gallbladder side and divided. The cystic artery was identified, dissected free, ligated with clips and divided as well. Posterior branch of cystic artery divided between clips  The gallbladder was dissected from the liver bed in retrograde fashion with the electrocautery. The gallbladder was removed. The liver bed was irrigated and inspected. Hemostasis was achieved with the electrocautery. Copious irrigation was utilized and was repeatedly aspirated until clear all particulate matter. Surgicel snow placed in gallbladder fossa with excellent hemostasis.  Pneumoperitoneum was completely reduced after  viewing removal of the trocars under direct vision. The wound was thoroughly irrigated and the fascia was then closed with a figure of eight  suture; the skin was then closed with 4 o monocryl  and a Dermabond  was applied.  Instrument, sponge, and needle counts were correct at closure and at the conclusion of the case.   Findings: Cholecystitis with Cholelithiasis  Estimated Blood Loss: less than 50 mL         Drains: none         Total IV Fluids: 800 mL         Specimens: Gallbladder           Complications: None; patient tolerated the procedure well.         Disposition: PACU - hemodynamically stable.         Condition: stable

## 2012-01-15 NOTE — Discharge Instructions (Signed)
[facility] ______[facility] °LAPAROSCOPIC SURGERY: POST OP INSTRUCTIONS °Always review your discharge instruction sheet given to you by the facility where your surgery was performed. °IF YOU HAVE DISABILITY OR FAMILY LEAVE FORMS, YOU MUST BRING THEM TO THE OFFICE FOR PROCESSING.   °DO NOT GIVE THEM TO YOUR DOCTOR. ° °1. A prescription for pain medication may be given to you upon discharge.  Take your pain medication as prescribed, if needed.  If narcotic pain medicine is not needed, then you may take acetaminophen (Tylenol) or ibuprofen (Advil) as needed. °2. Take your usually prescribed medications unless otherwise directed. °3. If you need a refill on your pain medication, please contact your pharmacy.  They will contact our office to request authorization. Prescriptions will not be filled after 5pm or on week-ends. °4. You should follow a light diet the first few days after arrival home, such as soup and crackers, etc.  Be sure to include lots of fluids daily. °5. Most patients will experience some swelling and bruising in the area of the incisions.  Ice packs will help.  Swelling and bruising can take several days to resolve.  °6. It is common to experience some constipation if taking pain medication after surgery.  Increasing fluid intake and taking a stool softener (such as Colace) will usually help or prevent this problem from occurring.  A mild laxative (Milk of Magnesia or Miralax) should be taken according to package instructions if there are no bowel movements after 48 hours. °7. Unless discharge instructions indicate otherwise, you may remove your bandages 24-48 hours after surgery, and you may shower at that time.  You may have steri-strips (small skin tapes) in place directly over the incision.  These strips should be left on the skin for 7-10 days.  If your surgeon used skin glue on the incision, you may shower in 24 hours.  The glue will flake off over the next 2-3 weeks.  Any sutures or  staples will be removed at the office during your follow-up visit. °8. ACTIVITIES:  You may resume regular (light) daily activities beginning the next day--such as daily self-care, walking, climbing stairs--gradually increasing activities as tolerated.  You may have sexual intercourse when it is comfortable.  Refrain from any heavy lifting or straining until approved by your doctor. °a. You may drive when you are no longer taking prescription pain medication, you can comfortably wear a seatbelt, and you can safely maneuver your car and apply brakes. °b. RETURN TO WORK:  __________________________________________________________ °9. You should see your doctor in the office for a follow-up appointment approximately 2-3 weeks after your surgery.  Make sure that you call for this appointment within a day or two after you arrive home to insure a convenient appointment time. °10. OTHER INSTRUCTIONS: __________________________________________________________________________________________________________________________ __________________________________________________________________________________________________________________________ °WHEN TO CALL YOUR DOCTOR: °1. Fever over 101.0 °2. Inability to urinate °3. Continued bleeding from incision. °4. Increased pain, redness, or drainage from the incision. °5. Increasing abdominal pain ° °The clinic staff is available to answer your questions during regular business hours.  Please don’t hesitate to call and ask to speak to one of the nurses for clinical concerns.  If you have a medical emergency, go to the nearest emergency room or call 911.  A surgeon from [facility] is always on call at the hospital. °[address], [address], [place], [place]  [address] ? [address] [address], [place], [place]   [address] °[phone] ? [phone] ? FAX [phone] °Web site:   [email or web address] °

## 2012-01-15 NOTE — Interval H&P Note (Signed)
History and Physical Interval Note:  01/15/2012 9:03 AM  Tricia Benson  has presented today for surgery, with the diagnosis of gallstones  The various methods of treatment have been discussed with the patient and family. After consideration of risks, benefits and other options for treatment, the patient has consented to  Procedure(s) (LRB): LAPAROSCOPIC CHOLECYSTECTOMY WITH INTRAOPERATIVE CHOLANGIOGRAM (N/A) as a surgical intervention .  The patients' history has been reviewed, patient examined, no change in status, stable for surgery.  I have reviewed the patients' chart and labs.  Questions were answered to the patient's satisfaction.     Wendel Homeyer A.

## 2012-01-15 NOTE — H&P (Signed)
Tricia Benson    MRN: [id number]   Description: 46 year old female  Provider: Dortha Schwalbe., MD  Department: [facility]        Diagnoses     Gallstones   - Primary    574.20      Reason for Visit     Cholelithiasis        Vitals - Last Recorded       BP Pulse Temp(Src) Resp Ht Wt    126/80  68  97.7 F (36.5 C) (Temporal)  18  5' 5.5" (1.664 m)  149 lb 2 oz (67.643 kg)          BMI              24.44 kg/m2                 Progress Notes     Tricia Rumberger A., MD   Patient ID: Tricia Benson, female   DOB: [birth year], 46 y.o.   MRN: [id number]    Chief Complaint   Patient presents with   .  Cholelithiasis      HPI Tricia Benson is a 46 y.o. female.   HPIPatient sent at the request of Dr.Alm due to right-sided abdominal pain. She was seen in the emergency room 3 weeks ago with the sudden onset of right flank and right upper quadrant abdominal pain that radiated toward her left side. The pain was sharp in nature was a 10 out of 10. It was associated with nausea and vomiting. This is the first time she's never had this type of pain. She has had no dysuria or polyuria. She has no suprapubic pain. CT scan showed multiple gallstones with minimal gallbladder wall thickening.    Past Medical History   Diagnosis  Date   .  Gallstones        History reviewed. No pertinent past surgical history.   History reviewed. No pertinent family history.   Social History History   Substance Use Topics   .  Smoking status:  Never Smoker    .  Smokeless tobacco:  Never Used   .  Alcohol Use:  No       Allergies   Allergen  Reactions   .  Aspirin  Rash       Current Outpatient Prescriptions   Medication  Sig  Dispense  Refill   .  ibuprofen (ADVIL,MOTRIN) 200 MG tablet  Take 200 mg by mouth every 6 (six) hours as needed. For headache pain         .  Pseudoephedrine-APAP-DM (DAYQUIL PO)  Take 30 mLs by mouth daily as needed. For cold              Review of Systems Review of Systems  Constitutional: Negative for fever, chills and unexpected weight change.  HENT: Negative for hearing loss, congestion, sore throat, trouble swallowing and voice change.   Eyes: Negative for visual disturbance.  Respiratory: Negative for cough and wheezing.   Cardiovascular: Negative for chest pain, palpitations and leg swelling.  Gastrointestinal: Negative for nausea, vomiting, abdominal pain, diarrhea, constipation, blood in stool, abdominal distention and anal bleeding.  Genitourinary: Negative for hematuria, vaginal bleeding and difficulty urinating.  Musculoskeletal: Negative for arthralgias.  Skin: Negative for rash and wound.  Neurological: Negative for seizures, syncope and headaches.  Hematological: Negative for adenopathy. Does not bruise/bleed easily.  Psychiatric/Behavioral: Negative for confusion.    Blood pressure 126/80, pulse 68, temperature  97.7 F (36.5 C), temperature source Temporal, resp. rate 18, height 5' 5.5" (1.664 m), weight 149 lb 2 oz (67.643 kg).   Physical Exam Physical Exam  Constitutional: She is oriented to person, place, and time. She appears well-developed and well-nourished.  HENT:   Head: Normocephalic and atraumatic.  Eyes: EOM are normal. Pupils are equal, round, and reactive to light.  Neck: Normal range of motion. Neck supple.  Cardiovascular: Normal rate and regular rhythm.   Pulmonary/Chest: Effort normal and breath sounds normal.  Abdominal: Soft. Bowel sounds are normal. She exhibits no distension. There is no tenderness. There is no rebound.  Musculoskeletal: Normal range of motion.  Neurological: She is alert and oriented to person, place, and time.  Skin: Skin is warm and dry.  Psychiatric: She has a normal mood and affect. Her behavior is normal. Judgment and thought content normal.    Data Reviewed CT as above.  CBC AND LFT OK   Assessment Symptomatic  cholelithiasis   Plan Laparoscopic cholecystectomy cholangiogram.The procedure has been discussed with the patient. Operative and non operative treatments have been discussed. Risks of surgery include bleeding, infection,  Common bile duct injury,  Injury to the stomach,liver, colon,small intestine, abdominal wall,  Diaphragm,  Major blood vessels,  And the need for an open procedure.  Other risks include worsening of medical problems, death,  DVT and pulmonary embolism, and cardiovascular events.   Medical options have also been discussed. The patient has been informed of long term expectations of surgery and non surgical options,  The patient agrees to proceed.           Tricia Kollmann A.  01/15/2012

## 2012-01-15 NOTE — Preoperative (Signed)
Beta Blockers   Reason not to administer Beta Blockers:Not Applicable 

## 2012-01-15 NOTE — Anesthesia Preprocedure Evaluation (Addendum)
Anesthesia Evaluation  Patient identified by MRN, date of birth, ID band Patient awake    Reviewed: Allergy & Precautions, H&P , NPO status , Patient's Chart, lab work & pertinent test results  History of Anesthesia Complications (+) PONV  Airway Mallampati: II TM Distance: >3 FB Neck ROM: Full    Dental No notable dental hx.    Pulmonary neg pulmonary ROS,  breath sounds clear to auscultation  Pulmonary exam normal       Cardiovascular negative cardio ROS  Rhythm:Regular Rate:Normal     Neuro/Psych negative neurological ROS  negative psych ROS   GI/Hepatic negative GI ROS, Neg liver ROS,   Endo/Other  negative endocrine ROS  Renal/GU negative Renal ROS  negative genitourinary   Musculoskeletal negative musculoskeletal ROS (+)   Abdominal   Peds negative pediatric ROS (+)  Hematology negative hematology ROS (+)   Anesthesia Other Findings   Reproductive/Obstetrics negative OB ROS                           Anesthesia Physical Anesthesia Plan  ASA: II  Anesthesia Plan: General   Post-op Pain Management:    Induction: Intravenous  Airway Management Planned: Oral ETT  Additional Equipment:   Intra-op Plan:   Post-operative Plan: Extubation in OR  Informed Consent: I have reviewed the patients History and Physical, chart, labs and discussed the procedure including the risks, benefits and alternatives for the proposed anesthesia with the patient or authorized representative who has indicated his/her understanding and acceptance.   Dental advisory given  Plan Discussed with: CRNA  Anesthesia Plan Comments:         Anesthesia Quick Evaluation  

## 2012-01-15 NOTE — Transfer of Care (Signed)
Immediate Anesthesia Transfer of Care Note  Patient: Tricia Benson  Procedure(s) Performed: Procedure(s) (LRB): LAPAROSCOPIC CHOLECYSTECTOMY WITH INTRAOPERATIVE CHOLANGIOGRAM (N/A)  Patient Location: PACU  Anesthesia Type: General  Level of Consciousness: awake, alert , oriented and patient cooperative  Airway & Oxygen Therapy: Patient Spontanous Breathing and Patient connected to face mask oxygen  Post-op Assessment: Report given to PACU RN, Post -op Vital signs reviewed and stable and Patient moving all extremities  Post vital signs: Reviewed and stable  Complications: No apparent anesthesia complications

## 2012-01-15 NOTE — Anesthesia Postprocedure Evaluation (Signed)
  Anesthesia Post-op Note  Patient: Tricia Benson  Procedure(s) Performed: Procedure(s) (LRB): LAPAROSCOPIC CHOLECYSTECTOMY WITH INTRAOPERATIVE CHOLANGIOGRAM (N/A)  Patient Location: PACU  Anesthesia Type: General  Level of Consciousness: awake and alert   Airway and Oxygen Therapy: Patient Spontanous Breathing  Post-op Pain: mild  Post-op Assessment: Post-op Vital signs reviewed, Patient's Cardiovascular Status Stable, Respiratory Function Stable, Patent Airway and No signs of Nausea or vomiting  Post-op Vital Signs: stable  Complications: No apparent anesthesia complications

## 2012-01-20 ENCOUNTER — Encounter (HOSPITAL_COMMUNITY): Payer: Self-pay | Admitting: Surgery

## 2012-02-01 ENCOUNTER — Ambulatory Visit (INDEPENDENT_AMBULATORY_CARE_PROVIDER_SITE_OTHER): Payer: BC Managed Care – PPO | Admitting: Surgery

## 2012-02-01 ENCOUNTER — Encounter (INDEPENDENT_AMBULATORY_CARE_PROVIDER_SITE_OTHER): Payer: Self-pay | Admitting: Surgery

## 2012-02-01 VITALS — BP 100/70 | HR 72 | Temp 97.4°F | Resp 14 | Ht 66.0 in | Wt 150.0 lb

## 2012-02-01 DIAGNOSIS — Z9889 Other specified postprocedural states: Secondary | ICD-10-CM

## 2012-02-01 NOTE — Progress Notes (Signed)
she is here for a postop visit following laparoscopic cholecystectomy.  Diet is being tolerated, bowels are moving.  No problems with incisions. Pathology showed chronic cholecystitis.Cholangiogram normal.    ABD:  Soft, incisions clean/dry/intact and solid.  Assessment:  Doing well postop.  Plan:  Lowfat diet recommended.  Activities as tolerated.  Return visit prn.

## 2012-02-01 NOTE — Patient Instructions (Signed)
Resume full activity. Return as needed.  

## 2012-07-01 IMAGING — CR DG CHEST 2V
2 series · 2 of 2 positions shown · non-contrast
Comparison: CT 11/24/2011

CLINICAL DATA: Preop chest x-ray.  Gallbladder surgery

CHEST - 2 VIEW

[w chest pa]
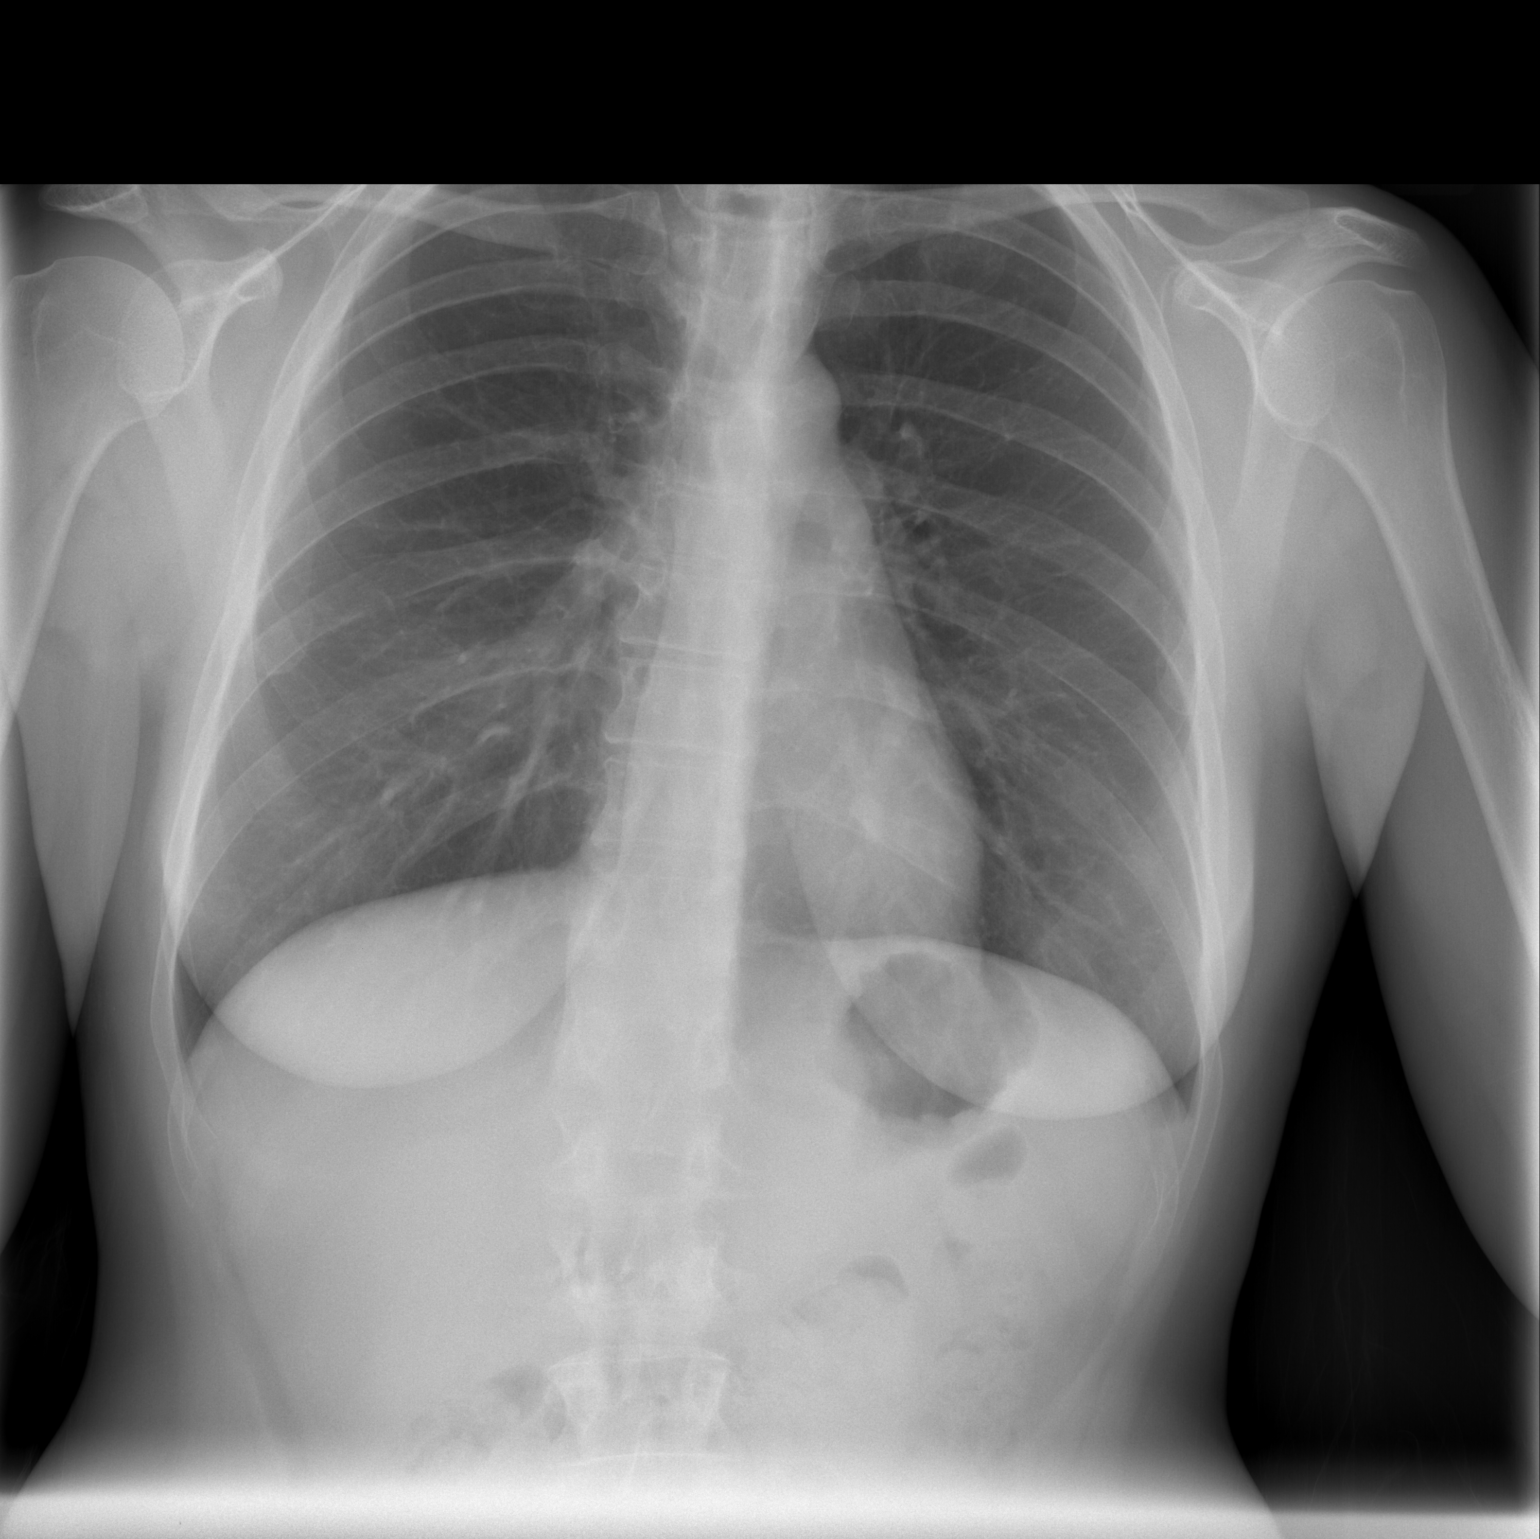

[w chest lat]
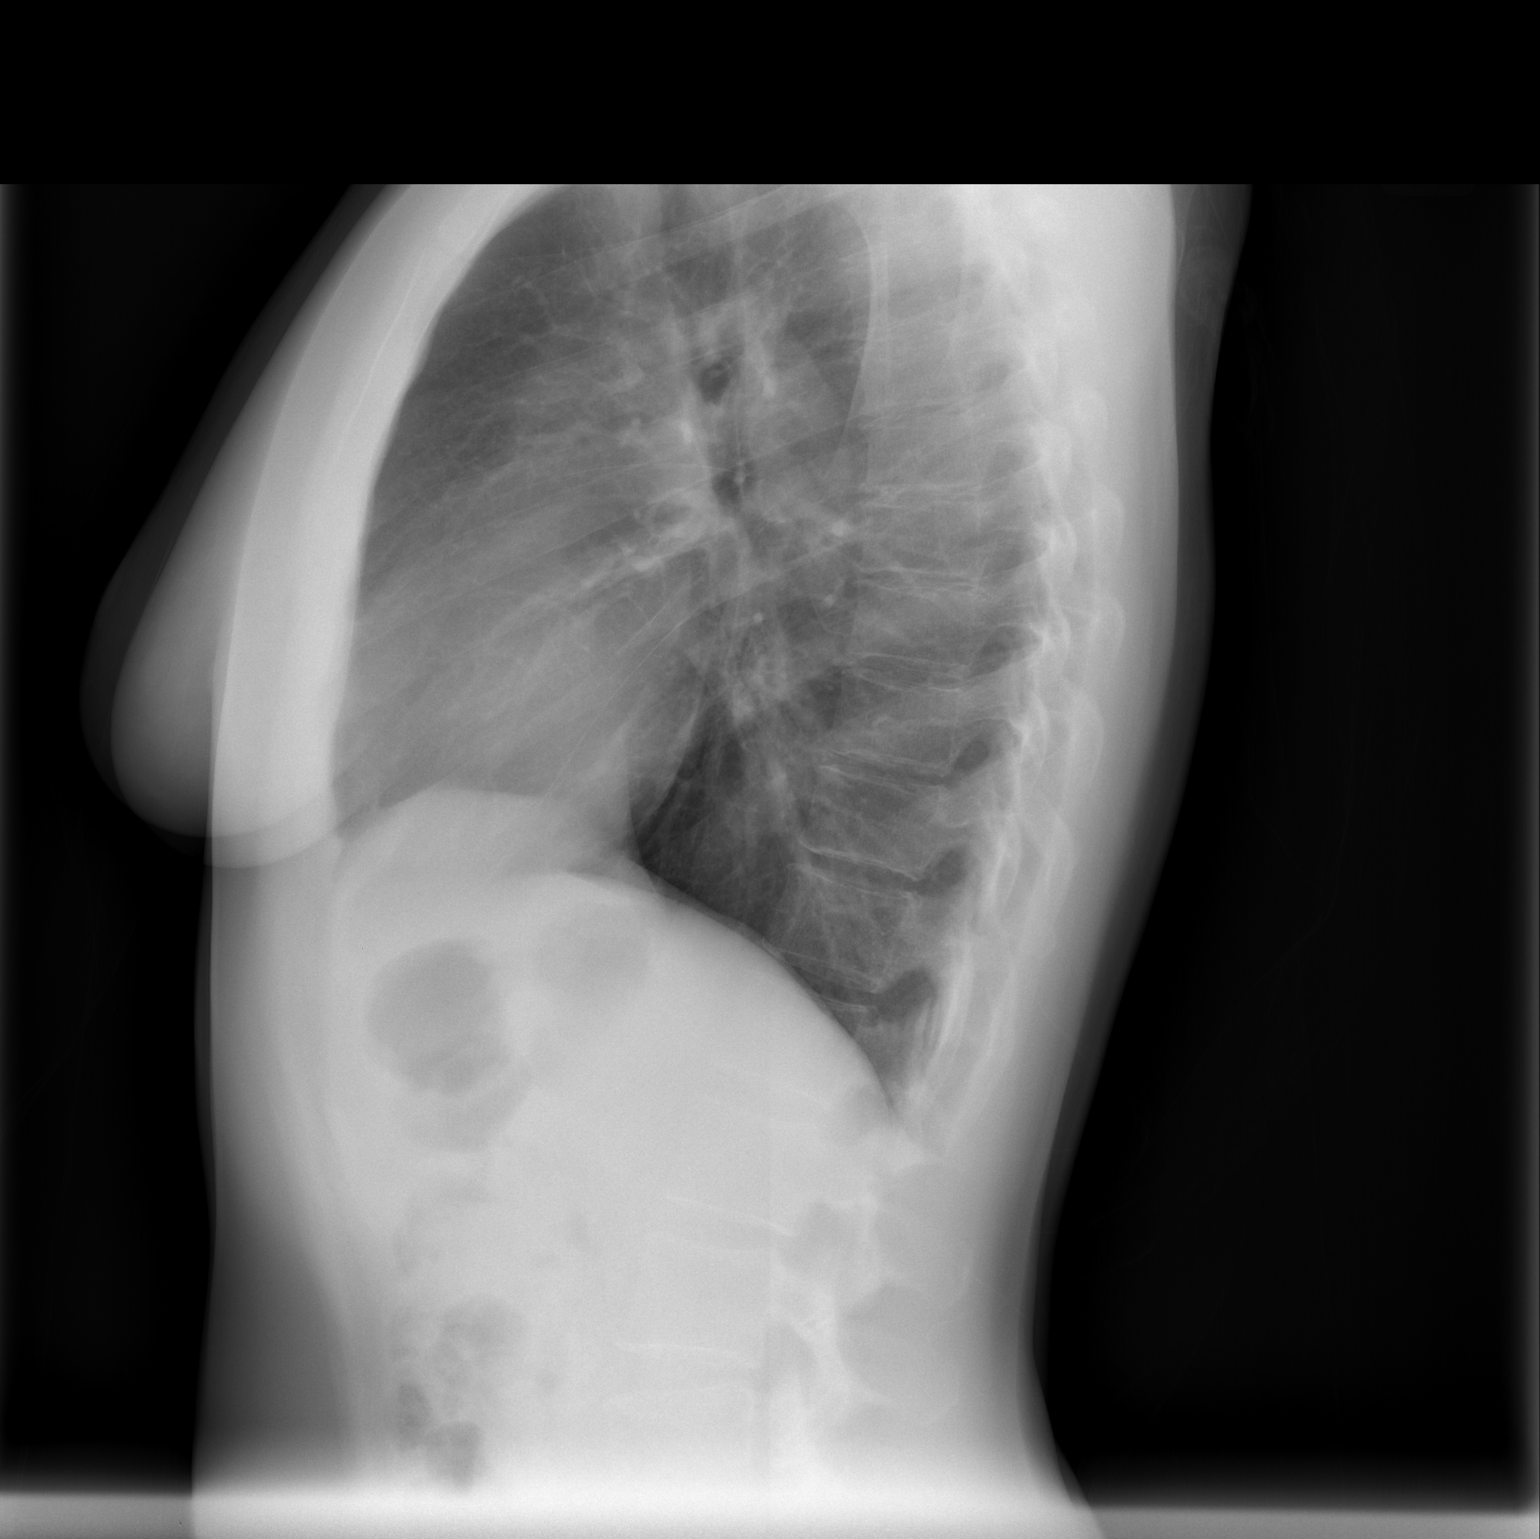

[2 of 2 positions shown; findings below may reference images not displayed]

FINDINGS: Normal mediastinum and cardiac silhouette.  Normal
pulmonary  vasculature.  No evidence of effusion, infiltrate, or
pneumothorax.  No acute bony abnormality.
IMPRESSION: No acute cardiopulmonary process.

## 2012-07-09 IMAGING — RF DG CHOLANGIOGRAM OPERATIVE
1 series · 4 of 4 positions shown · non-contrast
Comparison: None

CLINICAL DATA: Cholelithiasis

INTRAOPERATIVE CHOLANGIOGRAM
TECHNIQUE: Cholangiographic images from the C-arm fluoroscopic
device were submitted for interpretation post-operatively.  Please
see the procedural report for the amount of contrast and the
fluoroscopy time utilized.

[Series 1: run · 4 of 58 frames shown]
[frame 9/58]
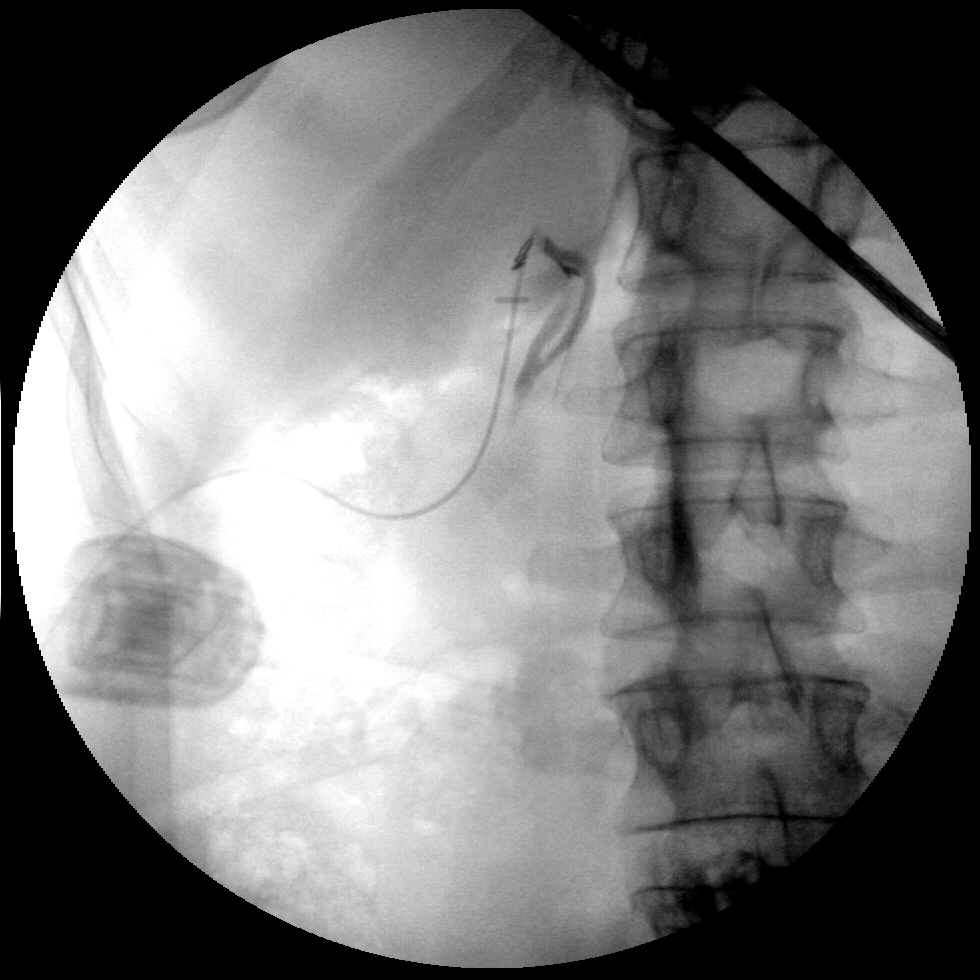
[frame 22/58]
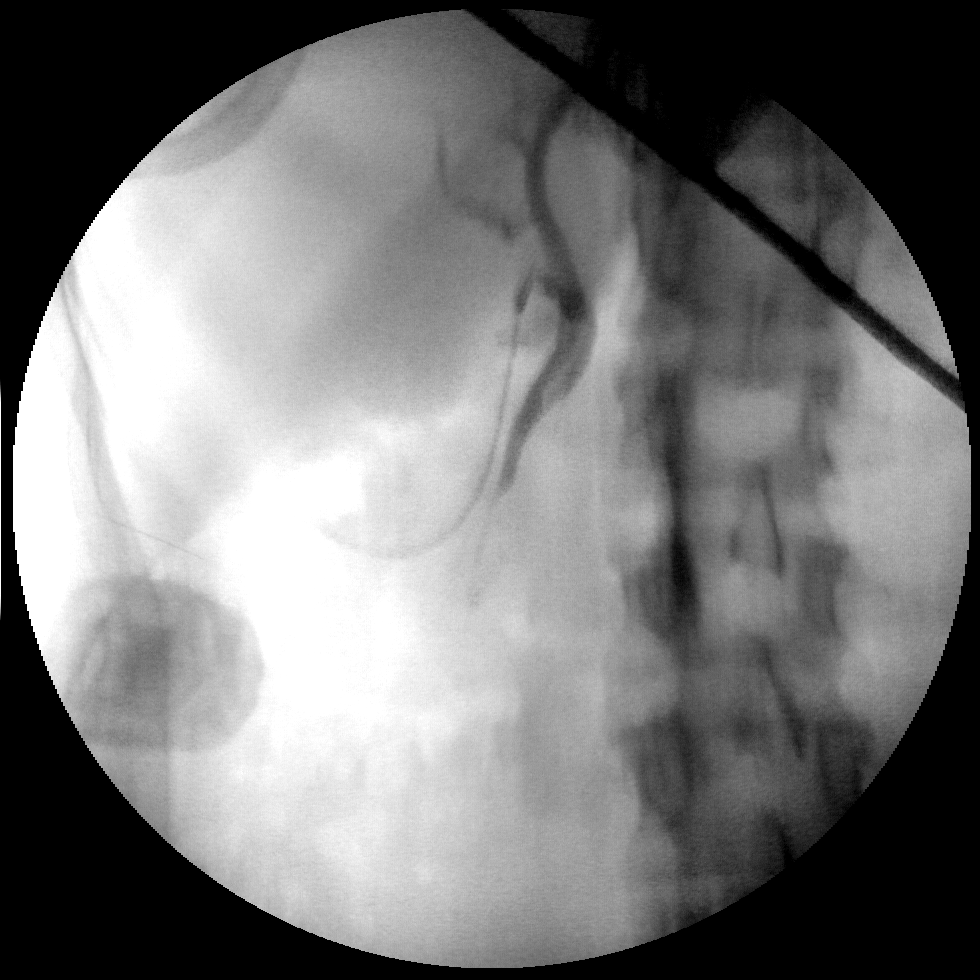
[frame 30/58]
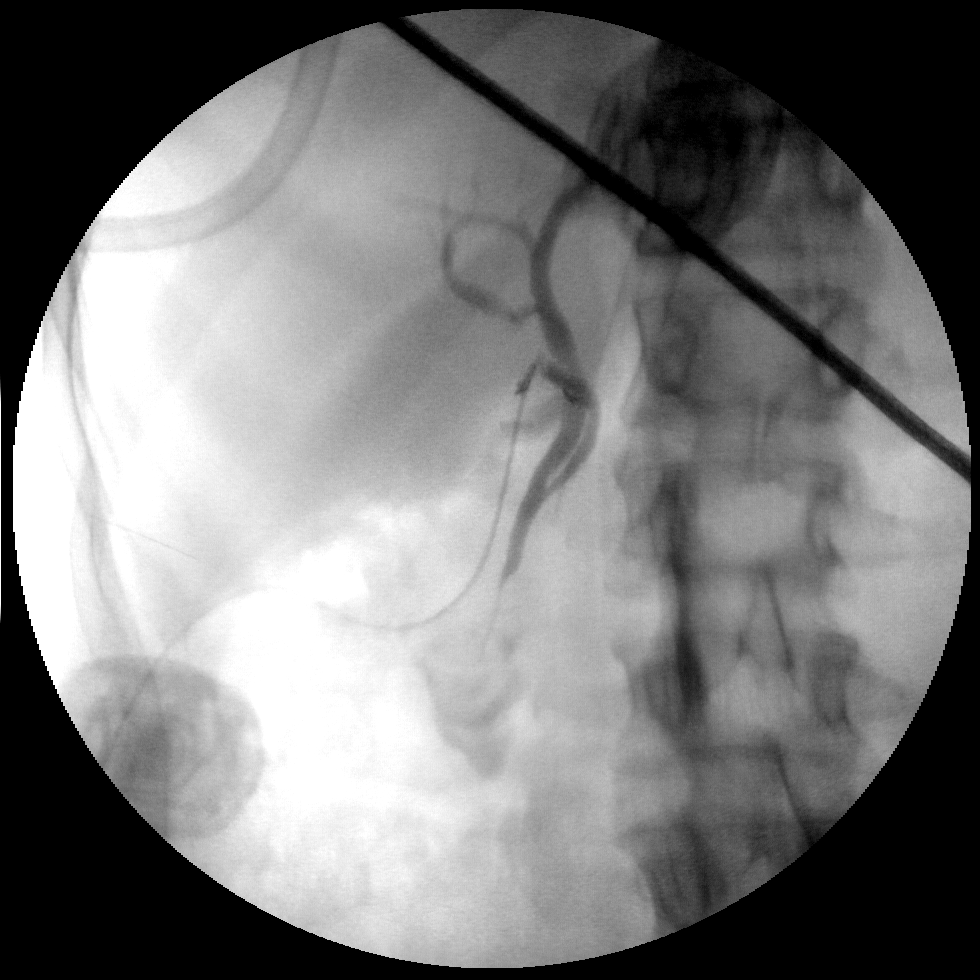
[frame 50/58]
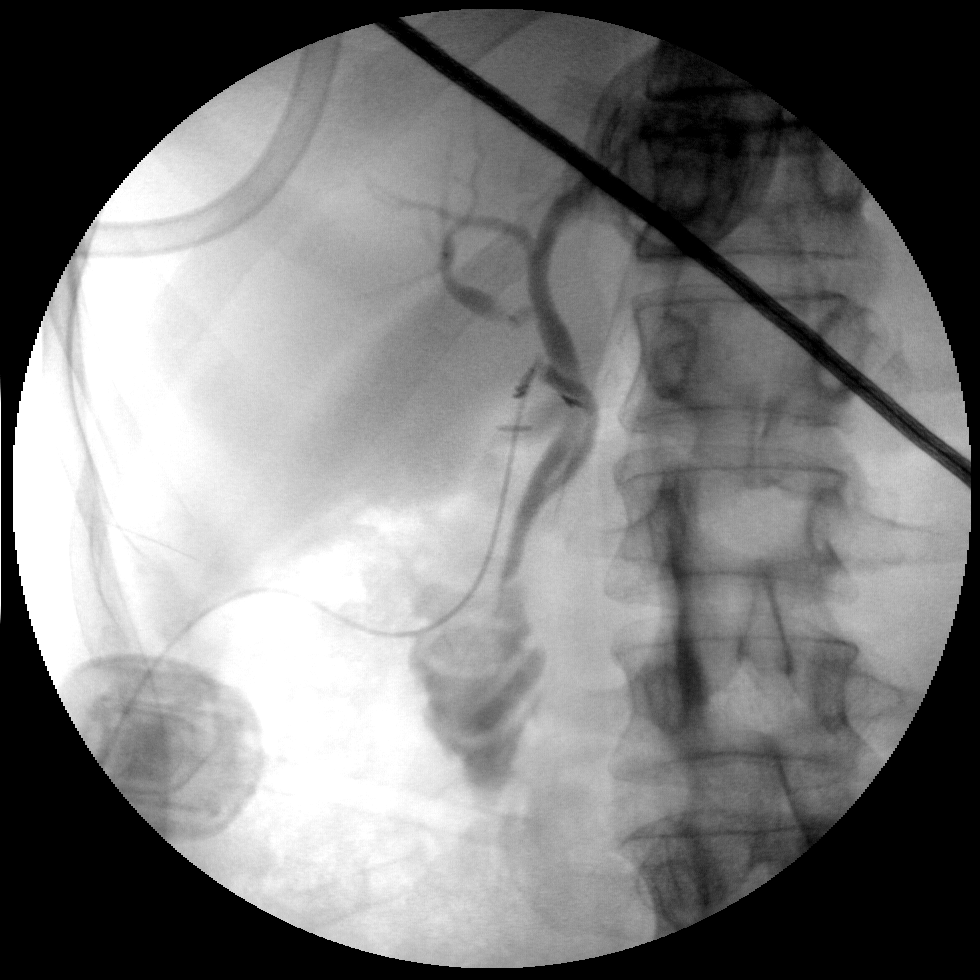

[4 of 4 positions shown; findings below may reference images not displayed]

FINDINGS: No persistent filling defects in the common duct.
Intrahepatic ducts are incompletely visualized, appearing
decompressed centrally. Contrast passes into the duodenum.

IMPRESSION

Negative for retained common duct stone.

## 2012-11-08 ENCOUNTER — Other Ambulatory Visit (HOSPITAL_COMMUNITY)
Admission: RE | Admit: 2012-11-08 | Discharge: 2012-11-08 | Disposition: A | Discharge status: Home / Self Care | Payer: BC Managed Care – PPO | Source: Ambulatory Visit | Attending: Obstetrics and Gynecology | Admitting: Obstetrics and Gynecology

## 2012-11-08 ENCOUNTER — Other Ambulatory Visit: Payer: Self-pay | Admitting: Nurse Practitioner

## 2012-11-08 DIAGNOSIS — Z01419 Encounter for gynecological examination (general) (routine) without abnormal findings: Secondary | ICD-10-CM | POA: Insufficient documentation

## 2012-11-08 DIAGNOSIS — Z1151 Encounter for screening for human papillomavirus (HPV): Secondary | ICD-10-CM | POA: Insufficient documentation

## 2014-11-08 ENCOUNTER — Other Ambulatory Visit: Payer: Self-pay | Admitting: Nurse Practitioner

## 2014-11-08 DIAGNOSIS — Z1231 Encounter for screening mammogram for malignant neoplasm of breast: Secondary | ICD-10-CM

## 2014-11-16 ENCOUNTER — Ambulatory Visit
Admission: RE | Admit: 2014-11-16 | Discharge: 2014-11-16 | Disposition: A | Discharge status: Home / Self Care | Payer: BC Managed Care – PPO | Source: Ambulatory Visit | Attending: Nurse Practitioner | Admitting: Nurse Practitioner

## 2014-11-16 ENCOUNTER — Encounter (INDEPENDENT_AMBULATORY_CARE_PROVIDER_SITE_OTHER): Payer: Self-pay

## 2014-11-16 DIAGNOSIS — Z1231 Encounter for screening mammogram for malignant neoplasm of breast: Secondary | ICD-10-CM

## 2019-10-20 ENCOUNTER — Other Ambulatory Visit: Payer: Self-pay

## 2019-10-20 ENCOUNTER — Other Ambulatory Visit: Payer: Self-pay | Admitting: *Deleted

## 2019-10-20 DIAGNOSIS — Z20822 Contact with and (suspected) exposure to covid-19: Secondary | ICD-10-CM

## 2019-10-21 LAB — NOVEL CORONAVIRUS, NAA: SARS-CoV-2, NAA: NOT DETECTED

## 2020-01-23 ENCOUNTER — Ambulatory Visit: Payer: BC Managed Care – PPO | Attending: Internal Medicine

## 2020-01-23 DIAGNOSIS — Z23 Encounter for immunization: Secondary | ICD-10-CM

## 2020-01-23 NOTE — Progress Notes (Signed)
   Covid-19 Vaccination Clinic  Name:  Tricia Benson    MRN: [id number] DOB: 1966-04-02  01/23/2020  Ms. Tricia Benson was observed post Covid-19 immunization for 15 minutes without incident. She was provided with Vaccine Information Sheet and instruction to access the V-Safe system.   Tricia Benson was instructed to call 911 with any severe reactions post vaccine: Marland Kitchen Difficulty breathing  . Swelling of face and throat  . A fast heartbeat  . A bad rash all over body  . Dizziness and weakness   Immunizations Administered    Name Date Dose VIS Date Route   Pfizer COVID-19 Vaccine 01/23/2020  1:03 PM 0.3 mL 10/18/2018 Intramuscular   Manufacturer: [employer], [employer]   Lot: [id number]   NDC: [address]-9

## 2020-02-15 ENCOUNTER — Ambulatory Visit: Payer: BC Managed Care – PPO | Attending: Internal Medicine

## 2020-02-15 DIAGNOSIS — Z23 Encounter for immunization: Secondary | ICD-10-CM

## 2020-02-15 NOTE — Progress Notes (Signed)
   Covid-19 Vaccination Clinic  Name:  Tricia Benson    MRN: [id number] DOB: Dec 26, 1965  02/15/2020  [name] was observed post Covid-19 immunization for 15 minutes without incident. She was provided with Vaccine Information Sheet and instruction to access the V-Safe system.   [name] was instructed to call 911 with any severe reactions post vaccine: Marland Kitchen Difficulty breathing  . Swelling of face and throat  . A fast heartbeat  . A bad rash all over body  . Dizziness and weakness   Immunizations Administered    Name Date Dose VIS Date Route   Pfizer COVID-19 Vaccine 02/15/2020 12:48 PM 0.3 mL 10/18/2018 Intramuscular   Manufacturer: [employer], [employer]   Lot: [id number]   NDC: [address]-3

## 2023-12-02 ENCOUNTER — Emergency Department (HOSPITAL_COMMUNITY)
Admission: EM | Admit: 2023-12-02 | Discharge: 2023-12-03 | Disposition: A | Discharge status: Home / Self Care | Payer: Self-pay | Attending: Emergency Medicine | Admitting: Emergency Medicine

## 2023-12-02 ENCOUNTER — Emergency Department (HOSPITAL_COMMUNITY): Payer: Self-pay

## 2023-12-02 ENCOUNTER — Other Ambulatory Visit: Payer: Self-pay

## 2023-12-02 DIAGNOSIS — M25562 Pain in left knee: Secondary | ICD-10-CM | POA: Insufficient documentation

## 2023-12-02 NOTE — ED Triage Notes (Signed)
 Pt has had left knee swelling for 2 weeks. Taking OTC meds with no relief. OTC meds gave her a rash so she is not taking anymore. Pt is ambulatory with pain. No injury to knee. Pt woke up 2 weeks ago and was unable to bear weight on the leg.

## 2023-12-03 NOTE — Progress Notes (Signed)
 Orthopedic Tech Progress Note Patient Details:  Tricia Benson Jan 02, 1966 [id number] Applied knee sleeve and gave pt instructions on how to use crutches.  Ortho Devices Type of Ortho Device: Crutches, Knee Sleeve Ortho Device/Splint Location: LLE Ortho Device/Splint Interventions: Ordered, Application, Adjustment   Post Interventions Patient Tolerated: Well Instructions Provided: Adjustment of device, Care of device, Poper ambulation with device  Blase Mess 12/03/2023, 1:09 AM

## 2023-12-03 NOTE — ED Provider Notes (Signed)
 [facility] EMERGENCY DEPARTMENT AT [facility] Provider Note   CSN: [id number] Arrival date & time: 12/02/23  1639     History  Chief Complaint  Patient presents with   Joint Swelling    Left knee    Tricia Benson is a 58 y.o. female.  58 yo F with a chief complaints of left knee pain.  This been going on for a couple weeks.  Hurts a long the medial aspect of the knee.  Pain worse to the posterior aspect of the knee.  Pain is worse with bearing weight.  She denies injury.        Home Medications Prior to Admission medications   Not on File      Allergies    Aspirin and Bee venom    Review of Systems   Review of Systems  Physical Exam Updated Vital Signs BP (!) 135/100   Pulse 75   Temp 97.7 F (36.5 C)   Resp 17   Ht 5\' 6"  (1.676 m)   Wt 68 kg   SpO2 100%   BMI 24.20 kg/m  Physical Exam Vitals and nursing note reviewed.  Constitutional:      General: She is not in acute distress.    Appearance: She is well-developed. She is not diaphoretic.  HENT:     Head: Normocephalic and atraumatic.  Eyes:     Pupils: Pupils are equal, round, and reactive to light.  Cardiovascular:     Rate and Rhythm: Normal rate and regular rhythm.     Heart sounds: No murmur heard.    No friction rub. No gallop.  Pulmonary:     Effort: Pulmonary effort is normal.     Breath sounds: No wheezing or rales.  Abdominal:     General: There is no distension.     Palpations: Abdomen is soft.     Tenderness: There is no abdominal tenderness.  Musculoskeletal:        General: No tenderness.     Cervical back: Normal range of motion and neck supple.     Comments: Negative McMurray's test.  Full range of motion without obvious discomfort.  She does have some ligamentous laxity with anterior distraction though seems pretty similar to the right.  Pulse motor and sensation intact distally.  Skin:    General: Skin is warm and dry.  Neurological:     Mental Status: She  is alert and oriented to person, place, and time.  Psychiatric:        Behavior: Behavior normal.     ED Results / Procedures / Treatments   Labs (all labs ordered are listed, but only abnormal results are displayed) Labs Reviewed - No data to display  EKG None  Radiology DG Knee 2 Views Left Result Date: 12/02/2023 CLINICAL DATA:  Left knee swelling for 2 weeks. No history of trauma EXAM: LEFT KNEE - 2 VIEW COMPARISON:  None Available. FINDINGS: No evidence of fracture, dislocation, or joint effusion. No evidence of arthropathy or other focal bone abnormality. Soft tissues are unremarkable. IMPRESSION: No acute osseous abnormality. Electronically Signed   By: Karen Kays M.D.   On: 12/02/2023 18:35    Procedures Procedures    Medications Ordered in ED Medications - No data to display  ED Course/ Medical Decision Making/ A&P  Medical Decision Making Amount and/or Complexity of Data Reviewed Radiology: ordered.   58 yo F with a chief complaint of left knee pain.  Going on for couple weeks now.  Atraumatic.  There is no edema no erythema no warmth.  Able to range it without difficulty.  She does have some ligamentous laxity though seems similar to the other side.  Knee sleeve for comfort.  Crutches.  Orthopedic follow-up.  Plain film of the left knee independently interpreted by me without fracture.  12:35 AM:  I have discussed the diagnosis/risks/treatment options with the patient.  Evaluation and diagnostic testing in the emergency department does not suggest an emergent condition requiring admission or immediate intervention beyond what has been performed at this time.  They will follow up with Ortho. We also discussed returning to the ED immediately if new or worsening sx occur. We discussed the sx which are most concerning (e.g., sudden worsening pain, fever, inability to tolerate by mouth) that necessitate immediate return. Medications  administered to the patient during their visit and any new prescriptions provided to the patient are listed below.  Medications given during this visit Medications - No data to display   The patient appears reasonably screen and/or stabilized for discharge and I doubt any other medical condition or other [facility] requiring further screening, evaluation, or treatment in the ED at this time prior to discharge.          Final Clinical Impression(s) / ED Diagnoses Final diagnoses:  Acute pain of left knee    Rx / DC Orders ED Discharge Orders     None         Melene Plan, DO 12/03/23 [phone]

## 2023-12-03 NOTE — Discharge Instructions (Signed)
 Your x-rays look okay.  Please follow-up with orthopedics in the office.  You could alternatively follow-up with your primary care provider.  I have given you some crutches to try and keep your weight off of it.  Max dosing of the over-the-counter medications listed below.  Take 4 over the counter ibuprofen tablets 3 times a day or 2 over-the-counter naproxen tablets twice a day for pain. Also take tylenol 1000mg (2 extra strength) four times a day.

## 2023-12-04 ENCOUNTER — Telehealth: Payer: Self-pay

## 2023-12-04 NOTE — Telephone Encounter (Signed)
 Called patient back discussed case with DR young, the MD who saw her is not working. He instructed that she needs to get works notes from ortho or PCP. She does not have a PCP, but will call ortho on Monday.

## 2023-12-04 NOTE — Telephone Encounter (Signed)
 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 Messaged Dr Inga Manges to see if work note can be extended. The patient has not made a orthopedic appointment yet, but planning on calling Monday.

## 2023-12-06 ENCOUNTER — Encounter (HOSPITAL_COMMUNITY): Payer: Self-pay

## 2023-12-06 ENCOUNTER — Emergency Department (HOSPITAL_COMMUNITY)
Admission: EM | Admit: 2023-12-06 | Discharge: 2023-12-06 | Disposition: A | Discharge status: Home / Self Care | Payer: Self-pay | Attending: Emergency Medicine | Admitting: Emergency Medicine

## 2023-12-06 ENCOUNTER — Other Ambulatory Visit: Payer: Self-pay

## 2023-12-06 DIAGNOSIS — M25562 Pain in left knee: Secondary | ICD-10-CM | POA: Insufficient documentation

## 2023-12-06 NOTE — ED Triage Notes (Signed)
 Patient was here thurs fri for knee pain/injury and is back because she was told she may need an MRI but everywhere she went will not take her because she has no health insurance.  Patient also wanting a work note because she couldn't go back to work on 4/13

## 2023-12-06 NOTE — Discharge Instructions (Signed)
 Please continue elevating your left leg, utilizing medications that were suggested at your last visit.  Please follow-up with [facility].  Return to ED with any new or worsening symptoms.

## 2023-12-06 NOTE — ED Provider Notes (Signed)
 [place] EMERGENCY DEPARTMENT AT [facility] Provider Note   CSN: [id number] Arrival date & time: 12/06/23  1255     History  Chief Complaint  Patient presents with   Knee Pain    Tricia Benson is a 58 y.o. female with medical history significant for seasonal allergies, headaches, gallstones, DDD.  Patient presents to ED for evaluation of left knee pain.  Reports that she was seen in this ED on 4/9 for concern of left knee pain that was atraumatic in nature.  The patient had unremarkable x-ray imaging at that time, was given knee brace and crutches and advised to follow-up with orthopedics.  She reports that she attempted to follow-up with the [facility] today however when she went to Dr. Mellody [employer] office she was advised that because she does not have health insurance they would not be able to see her.  She states she has full-time appointment but does not have health insurance for unknown reason.  Chart reviewed, patient had unremarkable x-ray of her left knee collected on 4/9.  She presents today requesting MRI of her left knee.  She denies any other concerns.  She states that with medication regimen that she was provided with at previous hospital visit, her pain has resolved.   Knee Pain      Home Medications Prior to Admission medications   Not on File      Allergies    Aspirin and Bee venom    Review of Systems   Review of Systems  Musculoskeletal:  Positive for arthralgias.  All other systems reviewed and are negative.   Physical Exam Updated Vital Signs BP (!) 130/92   Pulse 84   Temp 97.7 F (36.5 C) (Oral)   Resp 18   Ht 5\' 6"  (1.676 m)   Wt 67.6 kg   SpO2 98%   BMI 24.05 kg/m  Physical Exam Vitals and nursing note reviewed.  Constitutional:      General: She is not in acute distress.    Appearance: She is well-developed.  HENT:     Head: Normocephalic and atraumatic.  Eyes:     Conjunctiva/sclera: Conjunctivae normal.  Cardiovascular:      Rate and Rhythm: Normal rate and regular rhythm.     Heart sounds: No murmur heard. Pulmonary:     Effort: Pulmonary effort is normal. No respiratory distress.     Breath sounds: Normal breath sounds.  Abdominal:     Palpations: Abdomen is soft.     Tenderness: There is no abdominal tenderness.  Musculoskeletal:        General: No swelling.     Cervical back: Neck supple.     Comments: Appropriate range of motion appreciated to left knee.  No erythema, warmth, swelling.  Skin:    General: Skin is warm and dry.     Capillary Refill: Capillary refill takes less than 2 seconds.  Neurological:     Mental Status: She is alert.  Psychiatric:        Mood and Affect: Mood normal.     ED Results / Procedures / Treatments   Labs (all labs ordered are listed, but only abnormal results are displayed) Labs Reviewed - No data to display  EKG None  Radiology No results found.  Procedures Procedures   Medications Ordered in ED Medications - No data to display  ED Course/ Medical Decision Making/ A&P  Medical Decision Making  58 year old female presents for evaluation.  Please see HPI for  further details.  On examination patient is afebrile and nontachycardic.  Lung sounds are clear bilaterally, nonhypoxic.  Abdomen soft and compressible.  Neurological examinations at baseline.  Left knee without erythema, warmth or swelling.  She has full range of motion of her left knee.  No overlying skin change.  Patient here stating that she has attempted to go to 2 orthopedic doctors today for MRI of her left knee.  She reports that they would not take her because of her lack of health insurance.  She is here requesting MRI of her left knee.  I advised her that this would not be able to be done today.  I advised her that we reserve MRI machine for emergent needs.  She was understanding.  Ultimately she states that she does not have any health insurance.  She reports has a full-time job.  I will  refer her to [facility] where they can hopefully address her needs.  Stable to discharge.   Final Clinical Impression(s) / ED Diagnoses Final diagnoses:  Acute pain of left knee    Rx / DC Orders ED Discharge Orders     None         Adel Aden, PA-C 12/06/23 1354    Mordecai Applebaum, MD 12/07/23 1719
